# Patient Record
Sex: Male | Born: 1975 | Race: White | Hispanic: No | Smoking: Current every day smoker
Health system: Southern US, Community
[De-identification: ages and names within clinical notes are randomized; demographics above are authoritative.]

## PROBLEM LIST (undated history)

## (undated) DIAGNOSIS — Q049 Congenital malformation of brain, unspecified: Secondary | ICD-10-CM

## (undated) DIAGNOSIS — R1110 Vomiting, unspecified: Secondary | ICD-10-CM

## (undated) DIAGNOSIS — F12188 Cannabis abuse with other cannabis-induced disorder: Secondary | ICD-10-CM

## (undated) DIAGNOSIS — K3184 Gastroparesis: Secondary | ICD-10-CM

## (undated) HISTORY — PX: CHOLECYSTECTOMY: SHX55

---

## 2013-07-22 ENCOUNTER — Emergency Department (HOSPITAL_COMMUNITY)
Admission: EM | Admit: 2013-07-22 | Discharge: 2013-07-22 | Disposition: A | Discharge status: Home / Self Care | Payer: 59 | Attending: Emergency Medicine | Admitting: Emergency Medicine

## 2013-07-22 ENCOUNTER — Encounter (HOSPITAL_COMMUNITY): Payer: Self-pay | Admitting: Emergency Medicine

## 2013-07-22 ENCOUNTER — Emergency Department (HOSPITAL_COMMUNITY): Payer: 59

## 2013-07-22 DIAGNOSIS — R1110 Vomiting, unspecified: Secondary | ICD-10-CM

## 2013-07-22 DIAGNOSIS — R109 Unspecified abdominal pain: Secondary | ICD-10-CM

## 2013-07-22 DIAGNOSIS — R1084 Generalized abdominal pain: Secondary | ICD-10-CM | POA: Insufficient documentation

## 2013-07-22 DIAGNOSIS — Z9089 Acquired absence of other organs: Secondary | ICD-10-CM | POA: Insufficient documentation

## 2013-07-22 DIAGNOSIS — R630 Anorexia: Secondary | ICD-10-CM | POA: Insufficient documentation

## 2013-07-22 DIAGNOSIS — Z87891 Personal history of nicotine dependence: Secondary | ICD-10-CM | POA: Insufficient documentation

## 2013-07-22 DIAGNOSIS — R112 Nausea with vomiting, unspecified: Secondary | ICD-10-CM | POA: Insufficient documentation

## 2013-07-22 LAB — COMPREHENSIVE METABOLIC PANEL
AST: 36 U/L (ref 0–37)
Alkaline Phosphatase: 51 U/L (ref 39–117)
BUN: 20 mg/dL (ref 6–23)
CO2: 22 mEq/L (ref 19–32)
Calcium: 9.7 mg/dL (ref 8.4–10.5)
Chloride: 97 mEq/L (ref 96–112)
Creatinine, Ser: 0.87 mg/dL (ref 0.50–1.35)
GFR calc Af Amer: >90 mL/min (ref >90)
GFR calc non Af Amer: >90 mL/min (ref >90)
Glucose, Bld: 140 mg/dL — ABNORMAL HIGH (ref 70–99)
Total Bilirubin: 0.8 mg/dL (ref 0.3–1.2)

## 2013-07-22 LAB — CBC WITH DIFFERENTIAL/PLATELET
Basophils Absolute: 0 10*3/uL (ref 0.0–0.1)
Eosinophils Relative: 0 % (ref 0–5)
HCT: 50.4 % (ref 39.0–52.0)
Hemoglobin: 18.2 g/dL — ABNORMAL HIGH (ref 13.0–17.0)
Lymphocytes Relative: 17 % (ref 12–46)
Lymphs Abs: 1.6 10*3/uL (ref 0.7–4.0)
MCV: 88 fL (ref 78.0–100.0)
Monocytes Absolute: 1.6 10*3/uL — ABNORMAL HIGH (ref 0.1–1.0)
Monocytes Relative: 17 % — ABNORMAL HIGH (ref 3–12)
Neutro Abs: 6.3 10*3/uL (ref 1.7–7.7)
RDW: 13 % (ref 11.5–15.5)
WBC: 9.5 10*3/uL (ref 4.0–10.5)

## 2013-07-22 LAB — LIPASE, BLOOD: Lipase: 21 U/L (ref 11–59)

## 2013-07-22 LAB — URINALYSIS, ROUTINE W REFLEX MICROSCOPIC
Bilirubin Urine: NEGATIVE
Glucose, UA: NEGATIVE mg/dL
Ketones, ur: 15 mg/dL — AB
Leukocytes, UA: NEGATIVE
Protein, ur: NEGATIVE mg/dL
Urobilinogen, UA: 0.2 mg/dL (ref 0.0–1.0)

## 2013-07-22 MED ORDER — ONDANSETRON HCL 4 MG/2ML IJ SOLN
4.0000 mg | Freq: Once | INTRAMUSCULAR | Status: AC
  Administered 2013-07-22: 4 mg via INTRAVENOUS
  Filled 2013-07-22: qty 2

## 2013-07-22 MED ORDER — IOHEXOL 300 MG/ML  SOLN
25.0000 mL | INTRAMUSCULAR | Status: AC
  Administered 2013-07-22: 25 mL via ORAL

## 2013-07-22 MED ORDER — SODIUM CHLORIDE 0.9 % IV BOLUS (SEPSIS)
1000.0000 mL | Freq: Once | INTRAVENOUS | Status: AC
  Administered 2013-07-22: 1000 mL via INTRAVENOUS

## 2013-07-22 MED ORDER — SODIUM CHLORIDE 0.9 % IV SOLN
INTRAVENOUS | Status: DC
  Administered 2013-07-22 (×2): via INTRAVENOUS

## 2013-07-22 MED ORDER — ONDANSETRON 4 MG PO TBDP: 4.0000 mg | ORAL_TABLET | Freq: Three times a day (TID) | ORAL | Status: DC | PRN

## 2013-07-22 MED ORDER — PROMETHAZINE HCL 25 MG PO TABS: 25.0000 mg | ORAL_TABLET | Freq: Four times a day (QID) | ORAL | Status: DC | PRN

## 2013-07-22 MED ORDER — IOHEXOL 300 MG/ML  SOLN
100.0000 mL | Freq: Once | INTRAMUSCULAR | Status: AC | PRN
  Administered 2013-07-22: 100 mL via INTRAVENOUS

## 2013-07-22 NOTE — ED Provider Notes (Addendum)
CSN: [id number]     Arrival date & time 07/22/13  1221 History   First MD Initiated Contact with Patient 07/22/13 1229     Chief Complaint  Patient presents with  . Emesis   (Consider location/radiation/quality/duration/timing/severity/associated sxs/prior Treatment) Patient is a 37 y.o. male presenting with vomiting. The history is provided by the patient.  Emesis Associated symptoms: abdominal pain   Associated symptoms: no diarrhea and no headaches    patient with frequent and persistent vomiting now for over 48 hours. Was recently visiting in [place] family members there were ill with similar illness. However patient is having abdominal pain. No blood in the vomit no diarrhea. Last time patient had similar symptoms is related to his gallbladder that had to be removed. Abdominal pain stated to be 4 out of 10 diffuse in nature crampy and sharp  History reviewed. No pertinent past medical history. Past Surgical History  Procedure Laterality Date  . Cholecystectomy     History reviewed. No pertinent family history. History  Substance Use Topics  . Smoking status: Former Games developer  . Smokeless tobacco: Not on file  . Alcohol Use: No    Review of Systems  Constitutional: Positive for appetite change. Negative for fever.  HENT: Negative for congestion.   Eyes: Negative for redness.  Respiratory: Negative for shortness of breath.   Cardiovascular: Negative for chest pain.  Gastrointestinal: Positive for nausea, vomiting and abdominal pain. Negative for diarrhea.  Genitourinary: Negative for dysuria.  Musculoskeletal: Negative for back pain and neck pain.  Skin: Negative for rash.  Neurological: Negative for headaches.  Hematological: Does not bruise/bleed easily.  Psychiatric/Behavioral: Negative for confusion.    Allergies  Review of patient's allergies indicates no known allergies.  Home Medications  No current outpatient prescriptions on file. BP 112/62  Pulse 57   Temp(Src) 97.3 F (36.3 C) (Oral)  Resp 20  Ht 6\' 7"  (2.007 m)  Wt 190 lb (86.183 kg)  BMI 21.40 kg/m2  SpO2 95% Physical Exam  Nursing note and vitals reviewed. Constitutional: He is oriented to person, place, and time. He appears well-developed and well-nourished. No distress.  HENT:  Head: Normocephalic and atraumatic.  Next membranes are dry.  Eyes: Conjunctivae and EOM are normal. Pupils are equal, round, and reactive to light.  Neck: Normal range of motion.  Cardiovascular: Normal rate, regular rhythm and normal heart sounds.   No murmur heard. Pulmonary/Chest: Effort normal and breath sounds normal. No respiratory distress.  Abdominal: Soft. Bowel sounds are normal. He exhibits no distension. There is tenderness.  Mild diffuse tenderness.  Musculoskeletal: Normal range of motion. He exhibits no edema.  Neurological: He is alert and oriented to person, place, and time. No cranial nerve deficit. He exhibits normal muscle tone. Coordination normal.  Skin: Skin is warm. No rash noted.    ED Course  Procedures (including critical care time) Labs Review Labs Reviewed  CBC WITH DIFFERENTIAL - Abnormal; Notable for the following:    Hemoglobin 18.2 (*)    MCHC 36.1 (*)    Platelets 140 (*)    Monocytes Relative 17 (*)    Monocytes Absolute 1.6 (*)    All other components within normal limits  COMPREHENSIVE METABOLIC PANEL - Abnormal; Notable for the following:    Potassium 3.1 (*)    Glucose, Bld 140 (*)    All other components within normal limits  LIPASE, BLOOD  URINALYSIS, ROUTINE W REFLEX MICROSCOPIC   Results for orders placed during the hospital encounter of  07/22/13  CBC WITH DIFFERENTIAL      Result Value Range   WBC 9.5  4.0 - 10.5 K/uL   RBC 5.73  4.22 - 5.81 MIL/uL   Hemoglobin 18.2 (*) 13.0 - 17.0 g/dL   HCT 16.1  09.6 - 04.5 %   MCV 88.0  78.0 - 100.0 fL   MCH 31.8  26.0 - 34.0 pg   MCHC 36.1 (*) 30.0 - 36.0 g/dL   RDW 40.9  81.1 - 91.4 %   Platelets  140 (*) 150 - 400 K/uL   Neutrophils Relative % 66  43 - 77 %   Neutro Abs 6.3  1.7 - 7.7 K/uL   Lymphocytes Relative 17  12 - 46 %   Lymphs Abs 1.6  0.7 - 4.0 K/uL   Monocytes Relative 17 (*) 3 - 12 %   Monocytes Absolute 1.6 (*) 0.1 - 1.0 K/uL   Eosinophils Relative 0  0 - 5 %   Eosinophils Absolute 0.0  0.0 - 0.7 K/uL   Basophils Relative 0  0 - 1 %   Basophils Absolute 0.0  0.0 - 0.1 K/uL  COMPREHENSIVE METABOLIC PANEL      Result Value Range   Sodium 136  135 - 145 mEq/L   Potassium 3.1 (*) 3.5 - 5.1 mEq/L   Chloride 97  96 - 112 mEq/L   CO2 22  19 - 32 mEq/L   Glucose, Bld 140 (*) 70 - 99 mg/dL   BUN 20  6 - 23 mg/dL   Creatinine, Ser 7.82  0.50 - 1.35 mg/dL   Calcium 9.7  8.4 - 95.6 mg/dL   Total Protein 8.0  6.0 - 8.3 g/dL   Albumin 4.6  3.5 - 5.2 g/dL   AST 36  0 - 37 U/L   ALT 32  0 - 53 U/L   Alkaline Phosphatase 51  39 - 117 U/L   Total Bilirubin 0.8  0.3 - 1.2 mg/dL   GFR calc non Af Amer >90  >90 mL/min   GFR calc Af Amer >90  >90 mL/min  LIPASE, BLOOD      Result Value Range   Lipase 21  11 - 59 U/L    Imaging Review Ct Abdomen Pelvis W Contrast  07/22/2013   CLINICAL DATA:  Vomiting, unable to eat or drink anything, sick for 2 days  EXAM: CT ABDOMEN AND PELVIS WITH CONTRAST  TECHNIQUE: Multidetector CT imaging of the abdomen and pelvis was performed using the standard protocol following bolus administration of intravenous contrast. Sagittal and coronal MPR images reconstructed from axial data set.  CONTRAST:  OMNIPAQUE IOHEXOL 300 MG/ML SOLN Dilute oral contrast.  COMPARISON:  None  FINDINGS: Lung bases clear.  Gallbladder surgically absent.  10 mm nonspecific low-attenuation focus anteriorly of liver image 20.  Remainder of liver, spleen, pancreas, kidneys, and adrenal glands normal appearance.  Unremarkable bladder and ureters.  Proximal stomach incompletely distended, unable to adequately assess wall thickness.  Stomach and bowel loops otherwise  unremarkable.  No mass, adenopathy, free fluid or inflammatory process.  Few scattered pelvic phleboliths.  No acute osseous findings.  IMPRESSION: Nonspecific 10 mm low-attenuation focus in liver.  Otherwise negative exam.   Electronically Signed   By: Ulyses Southward M.D.   On: 07/22/2013 16:30    EKG Interpretation   None       MDM   1. Vomiting   2. Abdominal pain    The patient's symptoms seem to  be consistent with a viral gastritis. Was recently visiting [place] was exposed to several sick family members. The patient has been having trouble with vomiting significant amounts 12 times today no blood in it no diarrhea for over 48 hours now. Associated with some diffuse abdominal pain. Patient states is similar to the problem he had was gallbladder needed to be removed. Patient states abdominal pain is 6/10 is generalized achy sharp at times. Significant nausea pain is nonradiating not made worse or better by anything. Patient has been to keep any liquids down. CT abdomen was ordered to be complete. Patient has no evidence of leukocytosis liver function test are normal as may just very well be a viral gastroenteritis. Lipase is not elevated not consistent with pancreatitis.  CT scan is negative no acute abdominal process. Symptoms most likely related to a persistent viral gastritis. Patient CO2 shows no signs of significant metabolic acidosis. Will discharge home with anti-medics. Patient responded extremely well to Zofran.  Shelda Jakes, MD 07/22/13 1610  Shelda Jakes, MD 07/22/13 [phone]

## 2013-07-22 NOTE — ED Notes (Signed)
Per pt sts since Monday has been having vomiting. sts was recently in [place] and everyone has been sick that was there. Pt pale. Unable to eat or drink anything.

## 2013-11-13 ENCOUNTER — Encounter (HOSPITAL_COMMUNITY): Payer: Self-pay | Admitting: Emergency Medicine

## 2013-11-13 ENCOUNTER — Emergency Department (HOSPITAL_COMMUNITY)
Admission: EM | Admit: 2013-11-13 | Discharge: 2013-11-13 | Disposition: A | Discharge status: Home / Self Care | Payer: 59 | Attending: Emergency Medicine | Admitting: Emergency Medicine

## 2013-11-13 DIAGNOSIS — R112 Nausea with vomiting, unspecified: Secondary | ICD-10-CM | POA: Insufficient documentation

## 2013-11-13 DIAGNOSIS — Z9089 Acquired absence of other organs: Secondary | ICD-10-CM | POA: Insufficient documentation

## 2013-11-13 DIAGNOSIS — R630 Anorexia: Secondary | ICD-10-CM | POA: Insufficient documentation

## 2013-11-13 DIAGNOSIS — R1084 Generalized abdominal pain: Secondary | ICD-10-CM | POA: Insufficient documentation

## 2013-11-13 DIAGNOSIS — R5383 Other fatigue: Secondary | ICD-10-CM

## 2013-11-13 DIAGNOSIS — R198 Other specified symptoms and signs involving the digestive system and abdomen: Secondary | ICD-10-CM | POA: Insufficient documentation

## 2013-11-13 DIAGNOSIS — Z87891 Personal history of nicotine dependence: Secondary | ICD-10-CM | POA: Insufficient documentation

## 2013-11-13 DIAGNOSIS — R5381 Other malaise: Secondary | ICD-10-CM | POA: Insufficient documentation

## 2013-11-13 LAB — COMPREHENSIVE METABOLIC PANEL
ALT: 45 U/L (ref 0–53)
AST: 44 U/L — ABNORMAL HIGH (ref 0–37)
Albumin: 4.6 g/dL (ref 3.5–5.2)
Alkaline Phosphatase: 53 U/L (ref 39–117)
BILIRUBIN TOTAL: 1.7 mg/dL — AB (ref 0.3–1.2)
BUN: 16 mg/dL (ref 6–23)
CO2: 22 mEq/L (ref 19–32)
Calcium: 9.7 mg/dL (ref 8.4–10.5)
Chloride: 98 mEq/L (ref 96–112)
Creatinine, Ser: 0.93 mg/dL (ref 0.50–1.35)
GFR calc Af Amer: >90 mL/min (ref >90)
GFR calc non Af Amer: >90 mL/min (ref >90)
Glucose, Bld: 142 mg/dL — ABNORMAL HIGH (ref 70–99)
Potassium: 3.1 mEq/L — ABNORMAL LOW (ref 3.7–5.3)
Sodium: 139 mEq/L (ref 137–147)
TOTAL PROTEIN: 7.7 g/dL (ref 6.0–8.3)

## 2013-11-13 LAB — CBC WITH DIFFERENTIAL/PLATELET
Basophils Absolute: 0 10*3/uL (ref 0.0–0.1)
Basophils Relative: 0 % (ref 0–1)
EOS ABS: 0 10*3/uL (ref 0.0–0.7)
Eosinophils Relative: 0 % (ref 0–5)
HCT: 51.2 % (ref 39.0–52.0)
HEMOGLOBIN: 18.5 g/dL — AB (ref 13.0–17.0)
LYMPHS ABS: 1.7 10*3/uL (ref 0.7–4.0)
LYMPHS PCT: 14 % (ref 12–46)
MCH: 32.3 pg (ref 26.0–34.0)
MCHC: 36.1 g/dL — ABNORMAL HIGH (ref 30.0–36.0)
MCV: 89.5 fL (ref 78.0–100.0)
MONOS PCT: 15 % — AB (ref 3–12)
Monocytes Absolute: 2 10*3/uL — ABNORMAL HIGH (ref 0.1–1.0)
NEUTROS PCT: 71 % (ref 43–77)
Neutro Abs: 9.2 10*3/uL — ABNORMAL HIGH (ref 1.7–7.7)
Platelets: 146 10*3/uL — ABNORMAL LOW (ref 150–400)
RBC: 5.72 MIL/uL (ref 4.22–5.81)
RDW: 13.2 % (ref 11.5–15.5)
WBC: 12.9 10*3/uL — ABNORMAL HIGH (ref 4.0–10.5)

## 2013-11-13 LAB — URINALYSIS, ROUTINE W REFLEX MICROSCOPIC
Glucose, UA: NEGATIVE mg/dL
Hgb urine dipstick: NEGATIVE
KETONES UR: 15 mg/dL — AB
NITRITE: NEGATIVE
PROTEIN: 30 mg/dL — AB
SPECIFIC GRAVITY, URINE: 1.033 — AB (ref 1.005–1.030)
UROBILINOGEN UA: 1 mg/dL (ref 0.0–1.0)
pH: 6 (ref 5.0–8.0)

## 2013-11-13 LAB — URINE MICROSCOPIC-ADD ON

## 2013-11-13 LAB — RAPID URINE DRUG SCREEN, HOSP PERFORMED
Amphetamines: NOT DETECTED
BARBITURATES: NOT DETECTED
Benzodiazepines: NOT DETECTED
Cocaine: NOT DETECTED
Opiates: NOT DETECTED
TETRAHYDROCANNABINOL: POSITIVE — AB

## 2013-11-13 LAB — LIPASE, BLOOD: LIPASE: 21 U/L (ref 11–59)

## 2013-11-13 MED ORDER — SODIUM CHLORIDE 0.9 % IV SOLN
1000.0000 mL | Freq: Once | INTRAVENOUS | Status: AC
  Administered 2013-11-13: 1000 mL via INTRAVENOUS

## 2013-11-13 MED ORDER — DICYCLOMINE HCL 10 MG PO CAPS
10.0000 mg | ORAL_CAPSULE | Freq: Once | ORAL | Status: AC
  Administered 2013-11-13: 10 mg via ORAL
  Filled 2013-11-13: qty 1

## 2013-11-13 MED ORDER — DICYCLOMINE HCL 20 MG PO TABS: 20.0000 mg | ORAL_TABLET | Freq: Two times a day (BID) | ORAL | Status: DC

## 2013-11-13 MED ORDER — SODIUM CHLORIDE 0.9 % IV SOLN
1000.0000 mL | INTRAVENOUS | Status: DC
  Administered 2013-11-13: 1000 mL via INTRAVENOUS

## 2013-11-13 MED ORDER — PANTOPRAZOLE SODIUM 40 MG IV SOLR
40.0000 mg | INTRAVENOUS | Status: AC
  Administered 2013-11-13: 40 mg via INTRAVENOUS
  Filled 2013-11-13: qty 40

## 2013-11-13 MED ORDER — ONDANSETRON HCL 4 MG/2ML IJ SOLN
4.0000 mg | Freq: Once | INTRAMUSCULAR | Status: AC
  Administered 2013-11-13: 4 mg via INTRAVENOUS
  Filled 2013-11-13: qty 2

## 2013-11-13 NOTE — ED Provider Notes (Signed)
CSN: [id number]     Arrival date & time 11/13/13  0559 History   First MD Initiated Contact with Patient 11/13/13 [phone]     Chief Complaint  Patient presents with  . Emesis  . Abdominal Pain     (Consider location/radiation/quality/duration/timing/severity/associated sxs/prior Treatment) HPI  This is a 38 year old male who presents emergency department with chief complaint of abdominal pain, nausea and vomiting for the past 3 days.  He is a past surgical history of cholecystectomy. The patient states that he has had a diffuse, crampy, sharp abdominal pain, or retching and vomiting nonbilious nonbloody vomitus.  He states that his roommate had the same symptoms just before he got them.  He states he did try some marijuana 2 days ago to see if it would help alleviate his nausea.  He denies frequent or heavy use of marijuana.  Patient denies alcohol abuse. He did remember that he had some Phenergan and Zofran she began using yesterday.  He stated this helped stop his vomiting however every time he tried to drink fluids he would have severe epigastric pain.  Patient denies diarrhea, fever, chills.  No past medical history on file. Past Surgical History  Procedure Laterality Date  . Cholecystectomy     No family history on file. History  Substance Use Topics  . Smoking status: Former Research scientist (life sciences)  . Smokeless tobacco: Not on file  . Alcohol Use: No    Review of Systems  Constitutional: Positive for activity change, appetite change and fatigue. Negative for fever, chills and diaphoresis.  HENT: Negative.   Eyes: Negative.   Respiratory: Negative.   Cardiovascular: Negative.   Gastrointestinal: Positive for nausea, vomiting and abdominal pain. Negative for diarrhea, blood in stool and abdominal distention.  Genitourinary: Positive for decreased urine volume (first urine OP in 2 dfays this morning).  Musculoskeletal: Negative.   All other systems reviewed and are negative.      Allergies   Review of patient's allergies indicates no known allergies.  Home Medications   Current Outpatient Rx  Name  Route  Sig  Dispense  Refill  . ondansetron (ZOFRAN ODT) 4 MG disintegrating tablet   Oral   Take 1 tablet (4 mg total) by mouth every 8 (eight) hours as needed for nausea or vomiting.   12 tablet   1   . promethazine (PHENERGAN) 25 MG tablet   Oral   Take 1 tablet (25 mg total) by mouth every 6 (six) hours as needed for nausea or vomiting.   12 tablet   1    BP 140/99  Pulse 79  Temp(Src) 97.5 F (36.4 C) (Oral)  Resp 16  Ht 6\' 7"  (2.007 m)  Wt 200 lb (90.719 kg)  BMI 22.52 kg/m2  SpO2 97% Physical Exam  Nursing note and vitals reviewed. Constitutional: He appears well-developed and well-nourished. No distress.  HENT:  Head: Normocephalic and atraumatic.  Eyes: Conjunctivae are normal. No scleral icterus.  Neck: Normal range of motion. Neck supple.  Cardiovascular: Normal rate, regular rhythm and normal heart sounds.   Pulmonary/Chest: Effort normal and breath sounds normal. No respiratory distress.  Abdominal: Soft. There is no tenderness.  Musculoskeletal: He exhibits no edema.  Neurological: He is alert.  Skin: Skin is warm and dry. He is not diaphoretic.  Psychiatric: His behavior is normal.   . ED Course  Procedures (including critical care time) Labs Review Labs Reviewed - No data to display Imaging Review No results found.  EKG Interpretation  None       MDM   Final diagnoses:  Nausea and vomiting  Tenesmus    Patient with symptoms consistent with viral gastroenteritis.  Vitals are stable, no fever. tolerating PO fluids > 6 oz.  Lungs are clear.  No focal abdominal pain, no concern for appendicitis, cholecystitis, pancreatitis, ruptured viscus, UTI, kidney stone, or any other abdominal etiology.  Supportive therapy indicated with return if symptoms worsen.  Patient counseled. Has antiemetics from previous visit.     Margarita Mail, PA-C 11/13/13 [phone]

## 2013-11-13 NOTE — ED Notes (Signed)
Pt states he was seen in Aug for a stomach bug and given antinausea medications. Pt states he started vomiting on Tuesday after his friend and roommate have passed around another stomach bug. Pt states he tried the medications (zofran around 04:00 this morning and phenergan last night) pt states that he is still unable to keep food or water down. Pt states abdominal pain when he tries to eat or drink.

## 2013-11-13 NOTE — ED Notes (Signed)
Abigail, PA back at the bedside.

## 2013-11-13 NOTE — Discharge Instructions (Signed)
Abdominal (belly) pain can be caused by many things. Your caregiver performed an examination and possibly ordered blood/urine tests and imaging (CT scan, x-rays, ultrasound). Many cases can be observed and treated at home after initial evaluation in the emergency department. Even though you are being discharged home, abdominal pain can be unpredictable. Therefore, you need a repeated exam if your pain does not resolve, returns, or worsens. Most patients with abdominal pain don't have to be admitted to the hospital or have surgery, but serious problems like appendicitis and gallbladder attacks can start out as nonspecific pain. Many abdominal conditions cannot be diagnosed in one visit, so follow-up evaluations are very important. SEEK IMMEDIATE MEDICAL ATTENTION IF: The pain does not go away or becomes severe.  A temperature above 101 develops.  Repeated vomiting occurs (multiple episodes).  The pain becomes localized to portions of the abdomen. The right side could possibly be appendicitis. In an adult, the left lower portion of the abdomen could be colitis or diverticulitis.  Blood is being passed in stools or vomit (bright red or black tarry stools).  Return also if you develop chest pain, difficulty breathing, dizziness or fainting, or become confused, poorly responsive, or inconsolable (young children).    Abdominal Pain, Adult Many things can cause abdominal pain. Usually, abdominal pain is not caused by a disease and will improve without treatment. It can often be observed and treated at home. Your health care provider will do a physical exam and possibly order blood tests and X-rays to help determine the seriousness of your pain. However, in many cases, more time must pass before a clear cause of the pain can be found. Before that point, your health care provider may not know if you need more testing or further treatment. HOME CARE INSTRUCTIONS  Monitor your abdominal pain for any changes. The  following actions may help to alleviate any discomfort you are experiencing:  Only take over-the-counter or prescription medicines as directed by your health care provider.  Do not take laxatives unless directed to do so by your health care provider.  Try a clear liquid diet (broth, tea, or water) as directed by your health care provider. Slowly move to a bland diet as tolerated. SEEK MEDICAL CARE IF:  You have unexplained abdominal pain.  You have abdominal pain associated with nausea or diarrhea.  You have pain when you urinate or have a bowel movement.  You experience abdominal pain that wakes you in the night.  You have abdominal pain that is worsened or improved by eating food.  You have abdominal pain that is worsened with eating fatty foods. SEEK IMMEDIATE MEDICAL CARE IF:   Your pain does not go away within 2 hours.  You have a fever.  You keep throwing up (vomiting).  Your pain is felt only in portions of the abdomen, such as the right side or the left lower portion of the abdomen.  You pass bloody or black tarry stools. MAKE SURE YOU:  Understand these instructions.   Will watch your condition.   Will get help right away if you are not doing well or get worse.  Document Released: 06/13/2005 Document Revised: 06/24/2013 Document Reviewed: 05/13/2013 [facility] Patient Information 2014 [place].  Nausea and Vomiting Nausea is a sick feeling that often comes before throwing up (vomiting). Vomiting is a reflex where stomach contents come out of your mouth. Vomiting can cause severe loss of body fluids (dehydration). Children and elderly adults can become dehydrated quickly, especially if  they also have diarrhea. Nausea and vomiting are symptoms of a condition or disease. It is important to find the cause of your symptoms. CAUSES   Direct irritation of the stomach lining. This irritation can result from increased acid production (gastroesophageal reflux  disease), infection, food poisoning, taking certain medicines (such as nonsteroidal anti-inflammatory drugs), alcohol use, or tobacco use.  Signals from the brain.These signals could be caused by a headache, heat exposure, an inner ear disturbance, increased pressure in the brain from injury, infection, a tumor, or a concussion, pain, emotional stimulus, or metabolic problems.  An obstruction in the gastrointestinal tract (bowel obstruction).  Illnesses such as diabetes, hepatitis, gallbladder problems, appendicitis, kidney problems, cancer, sepsis, atypical symptoms of a heart attack, or eating disorders.  Medical treatments such as chemotherapy and radiation.  Receiving medicine that makes you sleep (general anesthetic) during surgery. DIAGNOSIS Your caregiver may ask for tests to be done if the problems do not improve after a few days. Tests may also be done if symptoms are severe or if the reason for the nausea and vomiting is not clear. Tests may include:  Urine tests.  Blood tests.  Stool tests.  Cultures (to look for evidence of infection).  X-rays or other imaging studies. Test results can help your caregiver make decisions about treatment or the need for additional tests. TREATMENT You need to stay well hydrated. Drink frequently but in small amounts.You may wish to drink water, sports drinks, clear broth, or eat frozen ice pops or gelatin dessert to help stay hydrated.When you eat, eating slowly may help prevent nausea.There are also some antinausea medicines that may help prevent nausea. HOME CARE INSTRUCTIONS   Take all medicine as directed by your caregiver.  If you do not have an appetite, do not force yourself to eat. However, you must continue to drink fluids.  If you have an appetite, eat a normal diet unless your caregiver tells you differently.  Eat a variety of complex carbohydrates (rice, wheat, potatoes, bread), lean meats, yogurt, fruits, and  vegetables.  Avoid high-fat foods because they are more difficult to digest.  Drink enough water and fluids to keep your urine clear or pale yellow.  If you are dehydrated, ask your caregiver for specific rehydration instructions. Signs of dehydration may include:  Severe thirst.  Dry lips and mouth.  Dizziness.  Dark urine.  Decreasing urine frequency and amount.  Confusion.  Rapid breathing or pulse. SEEK IMMEDIATE MEDICAL CARE IF:   You have blood or brown flecks (like coffee grounds) in your vomit.  You have black or bloody stools.  You have a severe headache or stiff neck.  You are confused.  You have severe abdominal pain.  You have chest pain or trouble breathing.  You do not urinate at least once every 8 hours.  You develop cold or clammy skin.  You continue to vomit for longer than 24 to 48 hours.  You have a fever. MAKE SURE YOU:   Understand these instructions.  Will watch your condition.  Will get help right away if you are not doing well or get worse. Document Released: 09/03/2005 Document Revised: 11/26/2011 Document Reviewed: 01/31/2011 [facility] Patient Information 2014 [place], [place].

## 2013-11-14 NOTE — ED Provider Notes (Signed)
Medical screening examination/treatment/procedure(s) were performed by non-physician practitioner and as supervising physician I was immediately available for consultation/collaboration.   EKG Interpretation None        Julianne Rice, MD 11/14/13 [phone]

## 2014-06-01 ENCOUNTER — Emergency Department (HOSPITAL_COMMUNITY)
Admission: EM | Admit: 2014-06-01 | Discharge: 2014-06-01 | Disposition: A | Discharge status: Home / Self Care | Payer: 59 | Attending: Emergency Medicine | Admitting: Emergency Medicine

## 2014-06-01 ENCOUNTER — Emergency Department (HOSPITAL_COMMUNITY): Payer: 59

## 2014-06-01 ENCOUNTER — Encounter (HOSPITAL_COMMUNITY): Payer: Self-pay | Admitting: Emergency Medicine

## 2014-06-01 DIAGNOSIS — R197 Diarrhea, unspecified: Secondary | ICD-10-CM | POA: Diagnosis not present

## 2014-06-01 DIAGNOSIS — R059 Cough, unspecified: Secondary | ICD-10-CM | POA: Diagnosis not present

## 2014-06-01 DIAGNOSIS — R6883 Chills (without fever): Secondary | ICD-10-CM | POA: Insufficient documentation

## 2014-06-01 DIAGNOSIS — IMO0001 Reserved for inherently not codable concepts without codable children: Secondary | ICD-10-CM | POA: Insufficient documentation

## 2014-06-01 DIAGNOSIS — R112 Nausea with vomiting, unspecified: Secondary | ICD-10-CM

## 2014-06-01 DIAGNOSIS — R05 Cough: Secondary | ICD-10-CM | POA: Insufficient documentation

## 2014-06-01 DIAGNOSIS — Z87891 Personal history of nicotine dependence: Secondary | ICD-10-CM | POA: Insufficient documentation

## 2014-06-01 LAB — CBC WITH DIFFERENTIAL/PLATELET
BASOS PCT: 0 % (ref 0–1)
Basophils Absolute: 0 10*3/uL (ref 0.0–0.1)
Eosinophils Absolute: 0 10*3/uL (ref 0.0–0.7)
Eosinophils Relative: 0 % (ref 0–5)
HEMATOCRIT: 49.8 % (ref 39.0–52.0)
Hemoglobin: 17.9 g/dL — ABNORMAL HIGH (ref 13.0–17.0)
Lymphocytes Relative: 7 % — ABNORMAL LOW (ref 12–46)
Lymphs Abs: 0.8 10*3/uL (ref 0.7–4.0)
MCH: 31 pg (ref 26.0–34.0)
MCHC: 35.9 g/dL (ref 30.0–36.0)
MCV: 86.2 fL (ref 78.0–100.0)
MONO ABS: 1 10*3/uL (ref 0.1–1.0)
MONOS PCT: 8 % (ref 3–12)
NEUTROS ABS: 10.3 10*3/uL — AB (ref 1.7–7.7)
Neutrophils Relative %: 85 % — ABNORMAL HIGH (ref 43–77)
Platelets: 182 10*3/uL (ref 150–400)
RBC: 5.78 MIL/uL (ref 4.22–5.81)
RDW: 13.2 % (ref 11.5–15.5)
WBC: 12.2 10*3/uL — ABNORMAL HIGH (ref 4.0–10.5)

## 2014-06-01 LAB — COMPREHENSIVE METABOLIC PANEL
ALK PHOS: 68 U/L (ref 39–117)
ALT: 35 U/L (ref 0–53)
ANION GAP: 22 — AB (ref 5–15)
AST: 23 U/L (ref 0–37)
Albumin: 4.8 g/dL (ref 3.5–5.2)
BILIRUBIN TOTAL: 0.7 mg/dL (ref 0.3–1.2)
BUN: 13 mg/dL (ref 6–23)
CHLORIDE: 103 meq/L (ref 96–112)
CO2: 16 mEq/L — ABNORMAL LOW (ref 19–32)
Calcium: 10.1 mg/dL (ref 8.4–10.5)
Creatinine, Ser: 0.91 mg/dL (ref 0.50–1.35)
GLUCOSE: 168 mg/dL — AB (ref 70–99)
POTASSIUM: 3.9 meq/L (ref 3.7–5.3)
Sodium: 141 mEq/L (ref 137–147)
Total Protein: 8.2 g/dL (ref 6.0–8.3)

## 2014-06-01 LAB — BASIC METABOLIC PANEL
Anion gap: 21 — ABNORMAL HIGH (ref 5–15)
BUN: 14 mg/dL (ref 6–23)
CALCIUM: 10 mg/dL (ref 8.4–10.5)
CO2: 18 mEq/L — ABNORMAL LOW (ref 19–32)
CREATININE: 0.91 mg/dL (ref 0.50–1.35)
Chloride: 103 mEq/L (ref 96–112)
GLUCOSE: 142 mg/dL — AB (ref 70–99)
Potassium: 3.6 mEq/L — ABNORMAL LOW (ref 3.7–5.3)
Sodium: 142 mEq/L (ref 137–147)

## 2014-06-01 LAB — LIPASE, BLOOD: LIPASE: 14 U/L (ref 11–59)

## 2014-06-01 LAB — I-STAT CG4 LACTIC ACID, ED: Lactic Acid, Venous: 1.86 mmol/L (ref 0.5–2.2)

## 2014-06-01 MED ORDER — DICYCLOMINE HCL 10 MG PO CAPS
10.0000 mg | ORAL_CAPSULE | Freq: Once | ORAL | Status: AC
  Administered 2014-06-01: 10 mg via ORAL
  Filled 2014-06-01: qty 1

## 2014-06-01 MED ORDER — FAMOTIDINE IN NACL 20-0.9 MG/50ML-% IV SOLN
20.0000 mg | Freq: Once | INTRAVENOUS | Status: AC
  Administered 2014-06-01: 20 mg via INTRAVENOUS
  Filled 2014-06-01: qty 50

## 2014-06-01 MED ORDER — ONDANSETRON HCL 4 MG/2ML IJ SOLN
4.0000 mg | Freq: Once | INTRAMUSCULAR | Status: AC
  Administered 2014-06-01: 4 mg via INTRAVENOUS
  Filled 2014-06-01: qty 2

## 2014-06-01 MED ORDER — LORAZEPAM 2 MG/ML IJ SOLN
1.0000 mg | Freq: Once | INTRAMUSCULAR | Status: AC
  Administered 2014-06-01: 1 mg via INTRAVENOUS
  Filled 2014-06-01: qty 1

## 2014-06-01 MED ORDER — PROMETHAZINE HCL 25 MG PO TABS: 25.0000 mg | ORAL_TABLET | Freq: Four times a day (QID) | ORAL | Status: DC | PRN

## 2014-06-01 MED ORDER — ONDANSETRON HCL 4 MG/2ML IJ SOLN: 4.0000 mg | Freq: Once | INTRAMUSCULAR | Status: DC

## 2014-06-01 MED ORDER — SODIUM CHLORIDE 0.9 % IV BOLUS (SEPSIS)
2000.0000 mL | Freq: Once | INTRAVENOUS | Status: AC
  Administered 2014-06-01: 2000 mL via INTRAVENOUS

## 2014-06-01 MED ORDER — ONDANSETRON 4 MG PO TBDP
8.0000 mg | ORAL_TABLET | Freq: Once | ORAL | Status: AC
  Administered 2014-06-01: 8 mg via ORAL
  Filled 2014-06-01: qty 2

## 2014-06-01 NOTE — ED Provider Notes (Signed)
CSN: [id number]     Arrival date & time 06/01/14  3 History   First MD Initiated Contact with Patient 06/01/14 1551     Chief Complaint  Patient presents with  . Nausea  . Emesis  . Diarrhea     (Consider location/radiation/quality/duration/timing/severity/associated sxs/prior Treatment) HPI  Steven Black is a 38 y.o. male who is otherwise healthy complaining of myalgia, chills, nausea vomiting and diarrhea onset yesterday. Patient denies abdominal pain but reports bilateral flank pain. He denies fever, sick contacts, recent travel, camping, tick bite, rash, headache, chest pain. Patient endorses a dry cough on review of systems. Denies any alcohol use, NSAID use, melena, hematochezia, states that the vomitus is nonbloody and nonbilious. States that he feels dehydrated.  History reviewed. No pertinent past medical history. Past Surgical History  Procedure Laterality Date  . Cholecystectomy     No family history on file. History  Substance Use Topics  . Smoking status: Former Research scientist (life sciences)  . Smokeless tobacco: Not on file  . Alcohol Use: No    Review of Systems  10 systems reviewed and found to be negative, except as noted in the HPI.   Allergies  Review of patient's allergies indicates no known allergies.  Home Medications   Prior to Admission medications   Medication Sig Start Date End Date Taking? Authorizing Provider  promethazine (PHENERGAN) 25 MG tablet Take 1 tablet (25 mg total) by mouth every 6 (six) hours as needed for nausea or vomiting. 06/01/14   Elmyra Ricks Severina Sykora, PA-C   BP 133/71  Pulse 104  Temp(Src) 98.4 F (36.9 C) (Oral)  Resp 16  Ht 6\' 7"  (2.007 m)  Wt 190 lb (86.183 kg)  BMI 21.40 kg/m2  SpO2 100% Physical Exam  Nursing note and vitals reviewed. Constitutional: He is oriented to person, place, and time. He appears well-developed and well-nourished. No distress.  HENT:  Head: Normocephalic.  Dry mucous membranes  Eyes: Conjunctivae and EOM are  normal.  Neck: Normal range of motion.  Cardiovascular: Normal rate, regular rhythm and intact distal pulses.   Pulmonary/Chest: Effort normal and breath sounds normal. No stridor. No respiratory distress. He has no wheezes. He has no rales. He exhibits no tenderness.  Abdominal: Soft. Bowel sounds are normal. He exhibits no distension and no mass. There is no tenderness. There is no rebound and no guarding.  Musculoskeletal: Normal range of motion.  Neurological: He is alert and oriented to person, place, and time.  Psychiatric: He has a normal mood and affect.    ED Course  Procedures (including critical care time) Labs Review Labs Reviewed  CBC WITH DIFFERENTIAL - Abnormal; Notable for the following:    WBC 12.2 (*)    Hemoglobin 17.9 (*)    Neutrophils Relative % 85 (*)    Neutro Abs 10.3 (*)    Lymphocytes Relative 7 (*)    All other components within normal limits  COMPREHENSIVE METABOLIC PANEL - Abnormal; Notable for the following:    CO2 16 (*)    Glucose, Bld 168 (*)    Anion gap 22 (*)    All other components within normal limits  BASIC METABOLIC PANEL - Abnormal; Notable for the following:    Potassium 3.6 (*)    CO2 18 (*)    Glucose, Bld 142 (*)    Anion gap 21 (*)    All other components within normal limits  LIPASE, BLOOD  I-STAT CG4 LACTIC ACID, ED    Imaging Review Dg Abd Acute  W/chest  06/01/2014   CLINICAL DATA:  Body aches with nausea vomiting and diarrhea beginning yesterday  EXAM: ACUTE ABDOMEN SERIES (ABDOMEN 2 VIEW & CHEST 1 VIEW)  COMPARISON:  Abdominal pelvic CT scan of July 22, 2013  FINDINGS: The lungs are mildly hyperinflated but clear. The heart and mediastinal structures are normal. There is no pleural effusion. There are loops of mildly distended gas-filled small bowel in the left lower quadrant. No significant rectal gas is demonstrated. There are surgical clips in the gallbladder fossa. There is a phlebolith in the left aspect of the pelvis.  The lumbar spine and observed portions of the bony pelvis are normal.  IMPRESSION: 1. The bowel gas pattern is nonspecific. There is no evidence of obstruction or perforation. 2. There is no acute cardiopulmonary abnormality.   Electronically Signed   By: Amro  [place]   On: 06/01/2014 17:09     EKG Interpretation None      MDM   Final diagnoses:  Nausea vomiting and diarrhea    Filed Vitals:   06/01/14 1344 06/01/14 1639 06/01/14 2029  BP: 130/87 144/88 133/71  Pulse: 96 66 104  Temp: 98.2 F (36.8 C) 98.4 F (36.9 C)   TempSrc: Oral Oral Oral  Resp: 20 12 16   Height: 6\' 7"  (2.007 m)    Weight: 190 lb (86.183 kg)    SpO2: 97% 98% 100%    Medications  ondansetron (ZOFRAN-ODT) disintegrating tablet 8 mg (8 mg Oral Given 06/01/14 1356)  sodium chloride 0.9 % bolus 2,000 mL (0 mLs Intravenous Stopped 06/01/14 2032)  ondansetron (ZOFRAN) injection 4 mg (4 mg Intravenous Given 06/01/14 1610)  famotidine (PEPCID) IVPB 20 mg (0 mg Intravenous Stopped 06/01/14 2032)  dicyclomine (BENTYL) capsule 10 mg (10 mg Oral Given 06/01/14 1731)  LORazepam (ATIVAN) injection 1 mg (1 mg Intravenous Given 06/01/14 1731)    Steven Black is a 37 y.o. male presenting with nausea vomiting and diarrhea. Patient appears dehydrated. Vital signs with no abnormalities. Abdominal exam is benign. Patient mildly acidotic, appears, hemoconcentrated with a white blood cell count of 12.2.  Serial abdominal exams are benign however patient has vomited again in the ED. Patient will be given Ativan and fluids were kept wide open.  Patient with normal lactic acid mildly decreased bicarbonate at 18. Increased anion gap. Patient directly asked about ingestions, alcohol which he denies. Case discussed with attending physician who agrees with stability to discharge to home. Extensive discussion of return precautions. Patient is asked to return to the ED immediately with any worsening of symptoms.  Evaluation does not show  pathology that would require ongoing emergent intervention or inpatient treatment. Pt is hemodynamically stable and mentating appropriately. Discussed findings and plan with patient/guardian, who agrees with care plan. All questions answered. Return precautions discussed and outpatient follow up given.   Discharge Medication List as of 06/01/2014  7:39 PM    START taking these medications   Details  promethazine (PHENERGAN) 25 MG tablet Take 1 tablet (25 mg total) by mouth every 6 (six) hours as needed for nausea or vomiting., Starting 06/01/2014, Until Discontinued, [employer], PA-C 06/02/14 [phone]

## 2014-06-01 NOTE — ED Notes (Addendum)
Pt actively vomitting, not able to drink fluids at this time.  Also asked pt for a urine sample and still unable to provide at this time.

## 2014-06-01 NOTE — ED Notes (Signed)
Pt c/o n/v/d that started yesterday. Denies abd pain/ anyone else being sick. Pt heard dry heaving earlier. Not actively vomiting at this time. Pt reports " I feel very dehydrated". Nad, skin warm and dry, resp e/u.

## 2014-06-01 NOTE — ED Notes (Signed)
Asked pt to provide urine specimen pt stated he could not provide one at this time.  

## 2014-06-01 NOTE — Discharge Instructions (Signed)
Push fluids: take small frequent sips of water or Gatorade, do not drink any soda, juice or caffeinated beverages.    Slowly resume solid diet as desired. Avoid food that are spicy, contain dairy and/or have high fat content.  Aviod NSAIDs (aspirin, motrin, ibuprofen, naproxen, Aleve et Ronney Asters) for pain control because they will irritate your stomach.  Do not hesitate to return to the emergency room for any new, worsening or concerning symptoms.  Please obtain primary care using resource guide below. But the minute you were seen in the emergency room and that they will need to obtain records for further outpatient management.     Emergency Department Resource Guide 1) Find a Doctor and Pay Out of Pocket Although you won't have to find out who is covered by your insurance plan, it is a good idea to ask around and get recommendations. You will then need to call the office and see if the doctor you have chosen will accept you as a new patient and what types of options they offer for patients who are self-pay. Some doctors offer discounts or will set up payment plans for their patients who do not have insurance, but you will need to ask so you aren't surprised when you get to your appointment.  2) Contact Your Local Health Department Not all health departments have doctors that can see patients for sick visits, but many do, so it is worth a call to see if yours does. If you don't know where your local health department is, you can check in your phone book. The CDC also has a tool to help you locate your state's health department, and many state websites also have listings of all of their local health departments.  3) Find a [facility] If your illness is not likely to be very severe or complicated, you may want to try a walk in clinic. These are popping up all over the country in pharmacies, drugstores, and shopping centers. They're usually staffed by nurse practitioners or physician assistants  that have been trained to treat common illnesses and complaints. They're usually fairly quick and inexpensive. However, if you have serious medical issues or chronic medical problems, these are probably not your best option.  No Primary Care Doctor: - Call Health Connect at  [phone] - they can help you locate a primary care doctor that  accepts your insurance, provides certain services, etc. - Physician Referral Service- [phone]  Chronic Pain Problems: Organization         Address  Phone   Notes  [facility]  [phone] Patients need to be referred by their primary care doctor.   Medication Assistance: Organization         Address  Phone   Notes  [facility] Medication [facility] [address] [place], [place] [address] [phone] --Must be a resident of [facility] -- Must have NO insurance coverage whatsoever (no Medicaid/ Medicare, etc.) -- The pt. MUST have a primary care doctor that directs their care regularly and follows them in the community   MedAssist  [phone]   [employer]  [phone]    Agencies that provide inexpensive medical care: Organization         Address  Phone   Notes  [place]  [phone]   [facility]    [phone]   [facility] [place], [place] [address] [phone]  [place] [address], [place] [phone]   Planned Parenthood    [phone]   [facility]    [phone]   [place] and [place] [address], [place] Phone:  [phone], Fax:  [phone] Hours of Operation:  9 am - 6 pm, M-F.  Also accepts Medicaid/Medicare and self-pay.  [facility] [place] for [place], [address], [place] Phone: [phone], Fax: [phone]. Hours of Operation:  8:30 am - 5:30 pm, M-F.  Also accepts Medicaid  and self-pay.  [facility] [place] [address], [place] Phone: [phone]   [place], [place], [place] [phone], Ext. [phone] Mondays & Thursdays: 7-9 AM.  First 15 patients are seen on a first come, first serve basis.    [place] Providers:  Organization         Address  Phone   Notes  [facility] [address], [place] [phone] Also accepts self-pay patients.  [facility] [address], [place]  [phone]   [place], [address], [place] [phone]   [facility] [address] [place] [phone]   Lucianne Lei [address], [place]   [phone] Only accepts [place] Access [place] patients after they have their name applied to their card.   Self-Pay (no insurance) in [facility]:  Organization         Address  Phone   Notes  Sickle Cell Patients, [facility] [place] [phone]   [facility] [place] [phone]   [facility] [place]  [place], [place], [place] [phone]   [facility]/Dr. Osei-Bonsu  [address] [place] or [address], [place] [phone] Phone number for both [place] and [place] locations is the same.  [facility] [address] [place] [phone]   [facility] [address] [place] or [address] [phone]   [facility] [address] [place] [phone], phone; [phone], fax Sees patients 1st and 3rd Saturday of every month.  Must not qualify for public or private insurance (i.e. Medicaid, Medicare, [place] Health Choice, Veterans' Benefits)  Household income  should be no more than 200% of the poverty level The clinic cannot treat you if you are pregnant or think you are pregnant  Sexually transmitted diseases are not treated at the clinic.    Dental Care: Organization         Address  Phone  Notes  [facility] Department of [facility] [place] [phone] Accepts children up to age 15 who are enrolled in [place] or [place]; pregnant women with a Medicaid card; and children who have applied for Medicaid or [place] Health Choice, but were declined, whose parents can pay a reduced fee at time of service.  [facility] Department of [facility]  [address], [place] [phone] Accepts children up to age 32 who are enrolled in [place] or [place]; pregnant women with a  Medicaid card; and children who have applied for Medicaid or [place] Health Choice, but were declined, whose parents can pay a reduced fee at time of service.  [place] [facility] Access [facility]  [address] [phone] Patients are seen by appointment only. Walk-ins are not accepted. [place] will see patients 78 years of age and older. Monday - Tuesday (8am-5pm) Most Wednesdays (8:30-5pm) $30 per visit, cash only  [facility] Adult Dental Access PROGRAM  [address], [facility] [phone] Patients are seen by appointment only. Walk-ins are not accepted. [place] will see patients 30 years of age and older. One Wednesday Evening (Monthly: Volunteer Based).  $30 per visit, cash only  [place]  [phone] for adults; Children under age 79, call Graduate Pediatric Dentistry at [phone]. Children aged 33-14, please call [phone] to request a pediatric application.  Dental services are provided in all areas of dental care including fillings, crowns and bridges, complete and partial dentures, implants, gum treatment,  root canals, and extractions. Preventive care is also provided. Treatment is provided to both adults and children. Patients are selected via a lottery and there is often a waiting list.   [facility] [address], [place]  [phone] [email or web address]   [facility] [address] [place], [place] [phone], Ext. [phone] Second and Fourth Thursday of each month, opens at 6:30 AM; Clinic ends at 9 AM.  Patients are seen on a first-come first-served basis, and a limited number are seen during each clinic.   [facility]  [address] Hillard Danker [place], [place] [phone]   Eligibility Requirements You must have lived in [place], [place], or [place] counties for at least the last three months.   You cannot be eligible for state or federal sponsored [employer], including [employer], [place], or [employer].   You generally cannot be eligible for healthcare insurance through your employer.    How to apply: Eligibility screenings are held every Tuesday and Wednesday afternoon from 1:00 pm until 4:00 pm. You do not need an appointment for the interview!  [facility] [address], [place], [place]   [place]  [place] Department  [place]  [phone]    Behavioral Health Resources in the Community: Intensive Outpatient Programs Organization         Address  Phone  Notes  [place]. [address] [place], [place] [phone]   [facility] Outpatient [address] [place], [place]   ADS: Alcohol & Drug Svcs [address] [place], [place]   [place] [address]  [place], [place] or [phone]   Substance Abuse Resources Organization         Address  Phone  Notes  Alcohol and Drug Services   [phone]   [place]  [phone]   [facility] [place]   Chinita Pester  [phone]   Residential & Outpatient Substance Abuse Program  [phone]   Psychological Services Organization         Address  Phone  Notes  [facility]  [place]  [phone]   [place] [address], [place] or [phone]    Mobile Crisis Teams Organization  Address  Phone  Notes  [facility]rapeutic Alternatives, Mobile Crisis Care Unit  [phone]   [facility]ive Psychotherapeutic Services  [address] [place], [place]   [facility] [address], [place] [phone]    Self-Help/Support Groups Organization         Address  Phone             Notes  [place]. of [place] - variety of support groups  [place] Call for more information  Narcotics Anonymous (NA), [facility] [address], [employer]   2 meetings at this location   Special educational needs teacher         Address  Phone  Notes  ASAP Residential Treatment [place],    [place]  [phone]   [facility]  [address] [place] [address], [place], [place]   Noel Coryell, [place] [phone] Admissions: 8am-3pm M-F  Incentives Substance [place] [address]    [place], [place] [phone]   [facility] [address] [place], [place], [place]   [facility] [address],  [place], [place]   [facility]ight Programs - Intensive Outpatient [address] Kristeen [address], [place], [place]   [facility] ([place].) [place].,  [place], [place] [phone] or [phone]   [facility] Services (RTS) [address], [place], [place] Accepts Medicaid  [facility] [place] [address]  [place] [phone] Substance Abuse/Addiction Treatment   [facility] Organization         Address  Phone  Notes  [facility]  [phone]   Domenic Schwab, PhD [address] Arlis Porta [place], [place]   [phone] or [phone]   [place], [place] [phone]   [facility] [address] [place], [place] [phone] Insurance/Medicaid/sponsorship through [facility] and Families [address], [address] [place]                                    [place], [place] [phone] [place] [address], [place] [phone]    Dr. Adele Schilder  [phone]   [facility] [place] Dept. 1) [address] [place] 2) [place] 3)  [place] [address], [place] [phone]  [phone]   [place] [phone] or [phone] (After Hours)

## 2014-06-01 NOTE — ED Notes (Signed)
Pt vomited a large amount of clear fluid.  MD notified.

## 2014-06-04 NOTE — ED Provider Notes (Signed)
History/physical exam/procedure(s) were performed by non-physician practitioner and as supervising physician I was immediately available for consultation/collaboration. I have reviewed all notes and am in agreement with care and plan.   Shaune Pollack, MD 06/04/14 [phone]

## 2015-05-19 ENCOUNTER — Emergency Department (HOSPITAL_COMMUNITY)
Admission: EM | Admit: 2015-05-19 | Discharge: 2015-05-19 | Disposition: A | Discharge status: Home / Self Care | Payer: 59 | Attending: Emergency Medicine | Admitting: Emergency Medicine

## 2015-05-19 ENCOUNTER — Encounter (HOSPITAL_COMMUNITY): Payer: Self-pay | Admitting: *Deleted

## 2015-05-19 DIAGNOSIS — R109 Unspecified abdominal pain: Secondary | ICD-10-CM | POA: Insufficient documentation

## 2015-05-19 DIAGNOSIS — R197 Diarrhea, unspecified: Secondary | ICD-10-CM | POA: Insufficient documentation

## 2015-05-19 DIAGNOSIS — R079 Chest pain, unspecified: Secondary | ICD-10-CM | POA: Insufficient documentation

## 2015-05-19 DIAGNOSIS — R61 Generalized hyperhidrosis: Secondary | ICD-10-CM | POA: Insufficient documentation

## 2015-05-19 DIAGNOSIS — Z87891 Personal history of nicotine dependence: Secondary | ICD-10-CM | POA: Insufficient documentation

## 2015-05-19 DIAGNOSIS — R112 Nausea with vomiting, unspecified: Secondary | ICD-10-CM | POA: Insufficient documentation

## 2015-05-19 HISTORY — DX: Vomiting, unspecified: R11.10

## 2015-05-19 LAB — COMPREHENSIVE METABOLIC PANEL
ALT: 61 U/L (ref 17–63)
ANION GAP: 14 (ref 5–15)
AST: 42 U/L — ABNORMAL HIGH (ref 15–41)
Albumin: 4.7 g/dL (ref 3.5–5.0)
Alkaline Phosphatase: 68 U/L (ref 38–126)
BUN: 8 mg/dL (ref 6–20)
CALCIUM: 9.9 mg/dL (ref 8.9–10.3)
CO2: 20 mmol/L — ABNORMAL LOW (ref 22–32)
Chloride: 107 mmol/L (ref 101–111)
Creatinine, Ser: 1.3 mg/dL — ABNORMAL HIGH (ref 0.61–1.24)
GFR calc non Af Amer: >60 mL/min (ref >60)
Glucose, Bld: 156 mg/dL — ABNORMAL HIGH (ref 65–99)
POTASSIUM: 4 mmol/L (ref 3.5–5.1)
Sodium: 141 mmol/L (ref 135–145)
Total Bilirubin: 1.4 mg/dL — ABNORMAL HIGH (ref 0.3–1.2)
Total Protein: 7.7 g/dL (ref 6.5–8.1)

## 2015-05-19 LAB — URINALYSIS, ROUTINE W REFLEX MICROSCOPIC
GLUCOSE, UA: NEGATIVE mg/dL
Hgb urine dipstick: NEGATIVE
Ketones, ur: >80 mg/dL — AB
Nitrite: NEGATIVE
Protein, ur: 30 mg/dL — AB
Specific Gravity, Urine: 1.028 (ref 1.005–1.030)
Urobilinogen, UA: 1 mg/dL (ref 0.0–1.0)
pH: 7 (ref 5.0–8.0)

## 2015-05-19 LAB — URINE MICROSCOPIC-ADD ON

## 2015-05-19 LAB — RAPID URINE DRUG SCREEN, HOSP PERFORMED
AMPHETAMINES: NOT DETECTED
BENZODIAZEPINES: NOT DETECTED
Barbiturates: NOT DETECTED
Cocaine: NOT DETECTED
OPIATES: NOT DETECTED
Tetrahydrocannabinol: POSITIVE — AB

## 2015-05-19 LAB — CBC
HCT: 53.3 % — ABNORMAL HIGH (ref 39.0–52.0)
HEMOGLOBIN: 18.6 g/dL — AB (ref 13.0–17.0)
MCH: 31.2 pg (ref 26.0–34.0)
MCHC: 34.9 g/dL (ref 30.0–36.0)
MCV: 89.4 fL (ref 78.0–100.0)
Platelets: 190 10*3/uL (ref 150–400)
RBC: 5.96 MIL/uL — AB (ref 4.22–5.81)
RDW: 13.4 % (ref 11.5–15.5)
WBC: 12.5 10*3/uL — ABNORMAL HIGH (ref 4.0–10.5)

## 2015-05-19 LAB — LIPASE, BLOOD: LIPASE: 18 U/L — AB (ref 22–51)

## 2015-05-19 MED ORDER — LORAZEPAM 2 MG/ML IJ SOLN
1.0000 mg | Freq: Once | INTRAMUSCULAR | Status: AC
  Administered 2015-05-19: 1 mg via INTRAVENOUS
  Filled 2015-05-19: qty 1

## 2015-05-19 MED ORDER — ONDANSETRON HCL 4 MG PO TABS: 4.0000 mg | ORAL_TABLET | Freq: Four times a day (QID) | ORAL | Status: DC

## 2015-05-19 MED ORDER — ONDANSETRON HCL 4 MG/2ML IJ SOLN
4.0000 mg | Freq: Once | INTRAMUSCULAR | Status: AC | PRN
  Administered 2015-05-19: 4 mg via INTRAVENOUS
  Filled 2015-05-19: qty 2

## 2015-05-19 MED ORDER — LORAZEPAM 2 MG/ML IJ SOLN
1.0000 mg | Freq: Once | INTRAMUSCULAR | Status: AC
Start: 2015-05-19 — End: 2015-05-19
  Administered 2015-05-19: 1 mg via INTRAVENOUS
  Filled 2015-05-19: qty 1

## 2015-05-19 MED ORDER — DIPHENHYDRAMINE HCL 50 MG/ML IJ SOLN
25.0000 mg | Freq: Once | INTRAMUSCULAR | Status: AC
  Administered 2015-05-19: 25 mg via INTRAVENOUS
  Filled 2015-05-19: qty 1

## 2015-05-19 MED ORDER — METOCLOPRAMIDE HCL 5 MG/ML IJ SOLN
5.0000 mg | Freq: Once | INTRAMUSCULAR | Status: AC
  Administered 2015-05-19: 5 mg via INTRAVENOUS
  Filled 2015-05-19: qty 2

## 2015-05-19 NOTE — ED Provider Notes (Signed)
CSN: [id number]     Arrival date & time 05/19/15  1147 History   First MD Initiated Contact with Patient 05/19/15 1151     Chief Complaint  Patient presents with  . Emesis  . Generalized Body Aches   HPI  Steven Black is a 39 year old male presenting with emesis and nausea. States that last night he became extremely nauseous and began vomiting. The nausea has been constant and he has vomited 6-7 times since last evening. Associated with chills, diaphoresis and epigastric stomach pain. He reports that when he is done vomiting he has chest pain. He endorses an episode of diarrhea this morning. No blood in the stool or vomit. Has history of cholecystectomy but no other abdominal history. Denies fevers, headaches, sore throat, SOB, muscle aches or syncope. Denies alcohol or illicit drug use. States his arm gets "pins and needles" when the blood pressure cuff inflates. Feeling resolves quickly.   Past Medical History  Diagnosis Date  . Emesis    Past Surgical History  Procedure Laterality Date  . Cholecystectomy     No family history on file. Social History  Substance Use Topics  . Smoking status: Former Research scientist (life sciences)  . Smokeless tobacco: None  . Alcohol Use: No    Review of Systems  Constitutional: Positive for chills and diaphoresis. Negative for fever.  Respiratory: Negative for shortness of breath.   Cardiovascular: Positive for chest pain.  Gastrointestinal: Positive for nausea, vomiting, abdominal pain and diarrhea. Negative for blood in stool and abdominal distention.  Musculoskeletal: Negative for myalgias and arthralgias.  Neurological: Negative for syncope, weakness, light-headedness and headaches.      Allergies  Review of patient's allergies indicates no known allergies.  Home Medications   Prior to Admission medications   Medication Sig Start Date End Date Taking? Authorizing Provider  ondansetron (ZOFRAN) 4 MG tablet Take 1 tablet (4 mg total) by mouth every 6 (six)  hours. 05/19/15   [name], PA-C  promethazine (PHENERGAN) 25 MG tablet Take 1 tablet (25 mg total) by mouth every 6 (six) hours as needed for nausea or vomiting. Patient not taking: Reported on 05/19/2015 06/01/14   [name], PA-C   BP 150/89 mmHg  Pulse 98  Temp(Src) 97.9 F (36.6 C) (Oral)  Resp 18  Ht 6\' 7"  (2.007 m)  Wt 200 lb (90.719 kg)  BMI 22.52 kg/m2  SpO2 98% Physical Exam  Constitutional: He is oriented to person, place, and time. He appears well-developed and well-nourished. No distress.  HENT:  Head: Normocephalic and atraumatic.  Eyes: Conjunctivae and EOM are normal. Pupils are equal, round, and reactive to light.  Neck: Normal range of motion.  Cardiovascular: Normal rate, regular rhythm and normal heart sounds.   Pulmonary/Chest: Effort normal and breath sounds normal. No respiratory distress. He has no wheezes.  Abdominal: Soft. He exhibits no distension. There is tenderness (generalized). There is no rebound and no guarding.  Neurological: He is alert and oriented to person, place, and time.  Skin: Skin is warm. He is diaphoretic.  Psychiatric: He has a normal mood and affect. His behavior is normal.    ED Course  Procedures (including critical care time) Labs Review Labs Reviewed  LIPASE, BLOOD - Abnormal; Notable for the following:    Lipase 18 (*)    All other components within normal limits  COMPREHENSIVE METABOLIC PANEL - Abnormal; Notable for the following:    CO2 20 (*)    Glucose, Bld 156 (*)  Creatinine, Ser 1.30 (*)    AST 42 (*)    Total Bilirubin 1.4 (*)    All other components within normal limits  CBC - Abnormal; Notable for the following:    WBC 12.5 (*)    RBC 5.96 (*)    Hemoglobin 18.6 (*)    HCT 53.3 (*)    All other components within normal limits  URINALYSIS, ROUTINE W REFLEX MICROSCOPIC (NOT AT [facility]) - Abnormal; Notable for the following:    Color, Urine AMBER (*)    APPearance HAZY (*)    Bilirubin Urine SMALL (*)     Ketones, ur >80 (*)    Protein, ur 30 (*)    Leukocytes, UA TRACE (*)    All other components within normal limits  URINE RAPID DRUG SCREEN, HOSP PERFORMED - Abnormal; Notable for the following:    Tetrahydrocannabinol POSITIVE (*)    All other components within normal limits  URINE MICROSCOPIC-ADD ON - Abnormal; Notable for the following:    Bacteria, UA MANY (*)    All other components within normal limits    Imaging Review No results found. I have personally reviewed and evaluated these images and lab results as part of my medical decision-making.   EKG Interpretation None     Case discussed with Dr. [name]  MDM   Final diagnoses:  Nausea and vomiting, vomiting of unspecified type   Pt presenting with nausea and vomiting x 1 day. Pt has not been able to keep down solids or liquids since last night. Denies fevers but states he gets diaphoretic after vomiting. Endorses episode of diarrhea this morning and chills. Denies alcohol or illicit drug use. Pt with multiple ED visits for emesis in past 2 years.  UDS positive for tetrahydrocannabinol. Given IVF, zofran, ativan x 2, reglan and benadryl in ED. Labs reveal elevated creatinine likely from dehydration. UA noted for many bacteria, will send for culture. Pt not complaining of urinary symptoms. Pt ready for discharge once nausea and vomiting under control and able to PO challenge. Will give home prescription for zofran. Will discuss marijuana use and connection with chronic vomiting. Return precautions given in discharge paperwork. Pt signed out to [name].    [name], PA-C 05/19/15 1806  [name], MD 05/20/15 [phone]

## 2015-05-19 NOTE — Discharge Instructions (Signed)
-   Take zofran as needed for nausea - Keep liquid intake up, solids as tolerated - Tylenol or motrin for pain control - Return to ED with blood in vomit or stool, increasing severity of abdominal pain, fever develops or further worsening of symptoms

## 2015-05-19 NOTE — ED Notes (Signed)
Pt now c/o epigastric and chest pain.

## 2015-05-19 NOTE — ED Provider Notes (Signed)
Care assumed from [place] PA-C at shift change. Pt here with epigastric pain, n/v, has been given ativan, zofran, benadryl/reglan and is currently sleeping. Plan is to await to see if pt awakes and is still vomiting, if able to tolerate PO then can d/c home with zofran. +UDS for [facility] therefore pt needs to be instructed to stop smoking marijuana.   Labs reveal: elevated Cr likely from dehydration. Hemoconcentrated CBC. U/A with bacteria but only 0-2 WBC/hpf, and mucous present, therefore will send for culture.  Physical Exam  BP 115/53 mmHg  Pulse 78  Temp(Src) 97.9 F (36.6 C) (Oral)  Resp 18  Ht 6\' 7"  (2.007 m)  Wt 200 lb (90.719 kg)  BMI 22.52 kg/m2  SpO2 95%  Physical Exam Gen: afebrile, VSS, NAD; asleep but arousable HEENT: EOMI, MMM Resp: no resp distress CV: rate WNL Abd: appearance normal, nondistended MsK: moving all extremities with ease Neuro: A&O x4  ED Course  Procedures Results for orders placed or performed during the hospital encounter of 05/19/15  Lipase, blood  Result Value Ref Range   Lipase 18 (L) 22 - 51 U/L  Comprehensive metabolic panel  Result Value Ref Range   Sodium 141 135 - 145 mmol/L   Potassium 4.0 3.5 - 5.1 mmol/L   Chloride 107 101 - 111 mmol/L   CO2 20 (L) 22 - 32 mmol/L   Glucose, Bld 156 (H) 65 - 99 mg/dL   BUN 8 6 - 20 mg/dL   Creatinine, Ser 1.30 (H) 0.61 - 1.24 mg/dL   Calcium 9.9 8.9 - 10.3 mg/dL   Total Protein 7.7 6.5 - 8.1 g/dL   Albumin 4.7 3.5 - 5.0 g/dL   AST 42 (H) 15 - 41 U/L   ALT 61 17 - 63 U/L   Alkaline Phosphatase 68 38 - 126 U/L   Total Bilirubin 1.4 (H) 0.3 - 1.2 mg/dL   GFR calc non Af Amer >60 >60 mL/min   GFR calc Af Amer >60 >60 mL/min   Anion gap 14 5 - 15  CBC  Result Value Ref Range   WBC 12.5 (H) 4.0 - 10.5 K/uL   RBC 5.96 (H) 4.22 - 5.81 MIL/uL   Hemoglobin 18.6 (H) 13.0 - 17.0 g/dL   HCT 53.3 (H) 39.0 - 52.0 %   MCV 89.4 78.0 - 100.0 fL   MCH 31.2 26.0 - 34.0 pg   MCHC 34.9 30.0 - 36.0 g/dL    RDW 13.4 11.5 - 15.5 %   Platelets 190 150 - 400 K/uL  Urinalysis, Routine w reflex microscopic (not at [facility])  Result Value Ref Range   Color, Urine AMBER (A) YELLOW   APPearance HAZY (A) CLEAR   Specific Gravity, Urine 1.028 1.005 - 1.030   pH 7.0 5.0 - 8.0   Glucose, UA NEGATIVE NEGATIVE mg/dL   Hgb urine dipstick NEGATIVE NEGATIVE   Bilirubin Urine SMALL (A) NEGATIVE   Ketones, ur >80 (A) NEGATIVE mg/dL   Protein, ur 30 (A) NEGATIVE mg/dL   Urobilinogen, UA 1.0 0.0 - 1.0 mg/dL   Nitrite NEGATIVE NEGATIVE   Leukocytes, UA TRACE (A) NEGATIVE  Urine rapid drug screen (hosp performed)  Result Value Ref Range   Opiates NONE DETECTED NONE DETECTED   Cocaine NONE DETECTED NONE DETECTED   Benzodiazepines NONE DETECTED NONE DETECTED   Amphetamines NONE DETECTED NONE DETECTED   Tetrahydrocannabinol POSITIVE (A) NONE DETECTED   Barbiturates NONE DETECTED NONE DETECTED  Urine microscopic-add on  Result Value Ref Range  Squamous Epithelial / LPF RARE RARE   WBC, UA 0-2 <3 WBC/hpf   Bacteria, UA MANY (A) RARE   Urine-Other MUCOUS PRESENT    No results found.   Meds ordered this encounter  Medications  . ondansetron (ZOFRAN) injection 4 mg    Sig:   . LORazepam (ATIVAN) injection 1 mg    Sig:   . metoCLOPramide (REGLAN) injection 5 mg    Sig:   . diphenhydrAMINE (BENADRYL) injection 25 mg    Sig:   . LORazepam (ATIVAN) injection 1 mg    Sig:   . ondansetron (ZOFRAN) 4 MG tablet    Sig: Take 1 tablet (4 mg total) by mouth every 6 (six) hours.    Dispense:  12 tablet    Refill:  0     MDM:   ICD-9-CM ICD-10-CM   1. Nausea and vomiting, vomiting of unspecified type 787.01 R11.2    4:59 PM- pt tolerating PO well, no longer vomiting. Advised pt to stop smoking marijuana. Will d/c with instructions as written by Josephina Gip PA-C   Lieutenant Abarca Camprubi-Soms, PA-C 05/19/15 [place], MD 05/20/15 [phone]

## 2015-05-19 NOTE — ED Notes (Signed)
Pt states acute onset emesis since yesterday.  Diaphoretic and actively vomiting in triage.  Also c/o generalized body aches and extremity numbness.

## 2015-05-20 LAB — URINE CULTURE: Culture: NO GROWTH

## 2017-11-26 ENCOUNTER — Other Ambulatory Visit: Payer: Self-pay

## 2017-11-26 ENCOUNTER — Encounter (HOSPITAL_COMMUNITY): Payer: Self-pay | Admitting: Emergency Medicine

## 2017-11-26 ENCOUNTER — Emergency Department (HOSPITAL_COMMUNITY)
Admission: EM | Admit: 2017-11-26 | Discharge: 2017-11-26 | Disposition: A | Discharge status: Home / Self Care | Payer: Self-pay | Attending: Emergency Medicine | Admitting: Emergency Medicine

## 2017-11-26 DIAGNOSIS — Z5321 Procedure and treatment not carried out due to patient leaving prior to being seen by health care provider: Secondary | ICD-10-CM | POA: Insufficient documentation

## 2017-11-26 DIAGNOSIS — R109 Unspecified abdominal pain: Secondary | ICD-10-CM | POA: Insufficient documentation

## 2017-11-26 LAB — COMPREHENSIVE METABOLIC PANEL
ALBUMIN: 4.9 g/dL (ref 3.5–5.0)
ALT: 48 U/L (ref 17–63)
AST: 45 U/L — AB (ref 15–41)
Alkaline Phosphatase: 63 U/L (ref 38–126)
Anion gap: 17 — ABNORMAL HIGH (ref 5–15)
BUN: 13 mg/dL (ref 6–20)
CO2: 23 mmol/L (ref 22–32)
CREATININE: 1.4 mg/dL — AB (ref 0.61–1.24)
Calcium: 10.4 mg/dL — ABNORMAL HIGH (ref 8.9–10.3)
Chloride: 95 mmol/L — ABNORMAL LOW (ref 101–111)
GFR calc Af Amer: >60 mL/min (ref >60)
GFR calc non Af Amer: >60 mL/min (ref >60)
GLUCOSE: 134 mg/dL — AB (ref 65–99)
Potassium: 3.3 mmol/L — ABNORMAL LOW (ref 3.5–5.1)
Sodium: 135 mmol/L (ref 135–145)
Total Bilirubin: 1.6 mg/dL — ABNORMAL HIGH (ref 0.3–1.2)
Total Protein: 8.3 g/dL — ABNORMAL HIGH (ref 6.5–8.1)

## 2017-11-26 LAB — CBC WITH DIFFERENTIAL/PLATELET
BASOS ABS: 0 10*3/uL (ref 0.0–0.1)
Basophils Relative: 0 %
Eosinophils Absolute: 0 10*3/uL (ref 0.0–0.7)
Eosinophils Relative: 0 %
HCT: 53.9 % — ABNORMAL HIGH (ref 39.0–52.0)
Hemoglobin: 18.6 g/dL — ABNORMAL HIGH (ref 13.0–17.0)
LYMPHS ABS: 3.3 10*3/uL (ref 0.7–4.0)
Lymphocytes Relative: 21 %
MCH: 30.9 pg (ref 26.0–34.0)
MCHC: 34.5 g/dL (ref 30.0–36.0)
MCV: 89.5 fL (ref 78.0–100.0)
MONOS PCT: 8 %
Monocytes Absolute: 1.2 10*3/uL — ABNORMAL HIGH (ref 0.1–1.0)
NEUTROS PCT: 71 %
Neutro Abs: 11.1 10*3/uL — ABNORMAL HIGH (ref 1.7–7.7)
PLATELETS: 194 10*3/uL (ref 150–400)
RBC: 6.02 MIL/uL — AB (ref 4.22–5.81)
RDW: 13.9 % (ref 11.5–15.5)
WBC: 15.6 10*3/uL — AB (ref 4.0–10.5)

## 2017-11-26 LAB — URINALYSIS, ROUTINE W REFLEX MICROSCOPIC
BACTERIA UA: NONE SEEN
Glucose, UA: NEGATIVE mg/dL
KETONES UR: 20 mg/dL — AB
LEUKOCYTES UA: NEGATIVE
Nitrite: NEGATIVE
PROTEIN: 100 mg/dL — AB
SQUAMOUS EPITHELIAL / LPF: NONE SEEN
Specific Gravity, Urine: 1.033 — ABNORMAL HIGH (ref 1.005–1.030)
pH: 5 (ref 5.0–8.0)

## 2017-11-26 LAB — LIPASE, BLOOD: Lipase: 26 U/L (ref 11–51)

## 2017-11-26 NOTE — ED Triage Notes (Addendum)
Patient reports pain across his abdomen onset Monday with emesis and diarrhea , he suspects foot poisoning after eating chicken , denies fever or chills .

## 2017-11-27 ENCOUNTER — Emergency Department (HOSPITAL_COMMUNITY): Payer: Self-pay

## 2017-11-27 ENCOUNTER — Emergency Department (HOSPITAL_COMMUNITY)
Admission: EM | Admit: 2017-11-27 | Discharge: 2017-11-27 | Disposition: A | Discharge status: Home / Self Care | Payer: Self-pay | Attending: Emergency Medicine | Admitting: Emergency Medicine

## 2017-11-27 ENCOUNTER — Encounter (HOSPITAL_COMMUNITY): Payer: Self-pay

## 2017-11-27 ENCOUNTER — Other Ambulatory Visit: Payer: Self-pay

## 2017-11-27 DIAGNOSIS — R197 Diarrhea, unspecified: Secondary | ICD-10-CM | POA: Diagnosis not present

## 2017-11-27 DIAGNOSIS — Z87891 Personal history of nicotine dependence: Secondary | ICD-10-CM | POA: Insufficient documentation

## 2017-11-27 DIAGNOSIS — R112 Nausea with vomiting, unspecified: Secondary | ICD-10-CM | POA: Insufficient documentation

## 2017-11-27 DIAGNOSIS — T50904A Poisoning by unspecified drugs, medicaments and biological substances, undetermined, initial encounter: Secondary | ICD-10-CM | POA: Diagnosis not present

## 2017-11-27 DIAGNOSIS — Z9049 Acquired absence of other specified parts of digestive tract: Secondary | ICD-10-CM | POA: Insufficient documentation

## 2017-11-27 DIAGNOSIS — R1013 Epigastric pain: Secondary | ICD-10-CM | POA: Insufficient documentation

## 2017-11-27 DIAGNOSIS — R1110 Vomiting, unspecified: Secondary | ICD-10-CM

## 2017-11-27 LAB — CBC
HEMATOCRIT: 56 % — AB (ref 39.0–52.0)
Hemoglobin: 19.4 g/dL — ABNORMAL HIGH (ref 13.0–17.0)
MCH: 30.5 pg (ref 26.0–34.0)
MCHC: 34.6 g/dL (ref 30.0–36.0)
MCV: 88.1 fL (ref 78.0–100.0)
Platelets: 217 10*3/uL (ref 150–400)
RBC: 6.36 MIL/uL — AB (ref 4.22–5.81)
RDW: 13.8 % (ref 11.5–15.5)
WBC: 23.2 10*3/uL — AB (ref 4.0–10.5)

## 2017-11-27 LAB — RAPID URINE DRUG SCREEN, HOSP PERFORMED
AMPHETAMINES: NOT DETECTED
Barbiturates: NOT DETECTED
Benzodiazepines: NOT DETECTED
Cocaine: NOT DETECTED
Opiates: NOT DETECTED
Tetrahydrocannabinol: POSITIVE — AB

## 2017-11-27 LAB — URINALYSIS, ROUTINE W REFLEX MICROSCOPIC
BILIRUBIN URINE: NEGATIVE
Glucose, UA: NEGATIVE mg/dL
Ketones, ur: 20 mg/dL — AB
LEUKOCYTES UA: NEGATIVE
NITRITE: NEGATIVE
PROTEIN: 30 mg/dL — AB
Specific Gravity, Urine: 1.031 — ABNORMAL HIGH (ref 1.005–1.030)
Squamous Epithelial / LPF: NONE SEEN
pH: 6 (ref 5.0–8.0)

## 2017-11-27 LAB — COMPREHENSIVE METABOLIC PANEL
ALBUMIN: 5.1 g/dL — AB (ref 3.5–5.0)
ALT: 54 U/L (ref 17–63)
ANION GAP: 16 — AB (ref 5–15)
AST: 58 U/L — ABNORMAL HIGH (ref 15–41)
Alkaline Phosphatase: 73 U/L (ref 38–126)
BILIRUBIN TOTAL: 1.4 mg/dL — AB (ref 0.3–1.2)
BUN: 18 mg/dL (ref 6–20)
CO2: 23 mmol/L (ref 22–32)
Calcium: 10.8 mg/dL — ABNORMAL HIGH (ref 8.9–10.3)
Chloride: 99 mmol/L — ABNORMAL LOW (ref 101–111)
Creatinine, Ser: 1.4 mg/dL — ABNORMAL HIGH (ref 0.61–1.24)
Glucose, Bld: 157 mg/dL — ABNORMAL HIGH (ref 65–99)
POTASSIUM: 3.7 mmol/L (ref 3.5–5.1)
Sodium: 138 mmol/L (ref 135–145)
TOTAL PROTEIN: 8.8 g/dL — AB (ref 6.5–8.1)

## 2017-11-27 LAB — RAPID STREP SCREEN (MED CTR MEBANE ONLY): STREPTOCOCCUS, GROUP A SCREEN (DIRECT): NEGATIVE

## 2017-11-27 LAB — LIPASE, BLOOD: Lipase: 25 U/L (ref 11–51)

## 2017-11-27 MED ORDER — HALOPERIDOL LACTATE 5 MG/ML IJ SOLN
5.0000 mg | Freq: Once | INTRAMUSCULAR | Status: AC
  Administered 2017-11-27: 5 mg via INTRAVENOUS
  Filled 2017-11-27: qty 1

## 2017-11-27 MED ORDER — SODIUM CHLORIDE 0.9 % IV BOLUS (SEPSIS)
1000.0000 mL | Freq: Once | INTRAVENOUS | Status: AC
  Administered 2017-11-27: 1000 mL via INTRAVENOUS

## 2017-11-27 MED ORDER — ONDANSETRON 4 MG PO TBDP
4.0000 mg | ORAL_TABLET | Freq: Once | ORAL | Status: AC | PRN
  Administered 2017-11-27: 4 mg via ORAL
  Filled 2017-11-27: qty 1

## 2017-11-27 MED ORDER — DICYCLOMINE HCL 10 MG/ML IM SOLN
20.0000 mg | Freq: Once | INTRAMUSCULAR | Status: AC
  Administered 2017-11-27: 20 mg via INTRAMUSCULAR
  Filled 2017-11-27: qty 2

## 2017-11-27 MED ORDER — PROMETHAZINE HCL 25 MG/ML IJ SOLN
25.0000 mg | Freq: Once | INTRAMUSCULAR | Status: DC
  Filled 2017-11-27: qty 1

## 2017-11-27 NOTE — ED Triage Notes (Signed)
Patient states abdominal pain and vomiting since Monday after eating chicken. Patient states he was at [facility] and left due to wait. Patient states vomiting-initial diarrhea but none at present time.Patient states pain across upper abdomen.

## 2017-11-27 NOTE — Discharge Instructions (Signed)
It was my pleasure taking care of you today!   Zofran as needed for nausea. Bentyl as needed for abdominal cramping.   Increase hydration.   Try to stop smoking marijuana. This can often cause or worsen nausea and vomiting. See attachment for further information about this.   Return to ER for new or worsening symptoms, any additional concerns.

## 2017-11-27 NOTE — ED Triage Notes (Signed)
Patient states he also has a sore throat and headache

## 2017-11-27 NOTE — ED Notes (Signed)
Pt given sprite and urinal, told to press call bell when he has urine sample.

## 2017-11-27 NOTE — ED Provider Notes (Signed)
[place] DEPT Provider Note   CSN: [id number] Arrival date & time: 11/27/17  0053     History   Chief Complaint Chief Complaint  Patient presents with  . Abdominal Pain  . Emesis    HPI Eloy Fehl is a 42 y.o. male.  The history is provided by the patient and medical records. No language interpreter was used.  Abdominal Pain   Associated symptoms include diarrhea, nausea and vomiting. Pertinent negatives include constipation.  Emesis   Associated symptoms include abdominal pain and diarrhea.   Tyjon Bowen is a 42 y.o. male who presents to the Emergency Department complaining of nausea and vomiting which began 2 days ago. She reports eating a chicken salad wrap then within 30 minutes, began vomiting.  Nobody else ate same food.  He endorses associated epigastric abdominal pain.  Also notes one, maybe 2 loose stools-no blood noted.  No fever or chills.  No chest pain or trouble breathing.  Tried taking Zofran at home with some relief.  He has had his gallbladder removed.   Past Medical History:  Diagnosis Date  . Emesis     There are no active problems to display for this patient.   Past Surgical History:  Procedure Laterality Date  . CHOLECYSTECTOMY         Home Medications    Prior to Admission medications   Medication Sig Start Date End Date Taking? Authorizing Provider  ondansetron (ZOFRAN) 4 MG tablet Take 1 tablet (4 mg total) by mouth every 6 (six) hours. Patient not taking: Reported on 11/27/2017 05/19/15   Barrett, Lahoma Crocker, PA-C  promethazine (PHENERGAN) 25 MG tablet Take 1 tablet (25 mg total) by mouth every 6 (six) hours as needed for nausea or vomiting. Patient not taking: Reported on 05/19/2015 06/01/14   Pisciotta, Charna Elizabeth    Family History No family history on file.  Social History Social History   Tobacco Use  . Smoking status: Former Research scientist (life sciences)  . Smokeless tobacco: Never Used  Substance Use Topics  . Alcohol  use: No  . Drug use: Yes    Types: Marijuana     Allergies   Patient has no known allergies.   Review of Systems Review of Systems  Gastrointestinal: Positive for abdominal pain, diarrhea, nausea and vomiting. Negative for constipation.  All other systems reviewed and are negative.    Physical Exam Updated Vital Signs BP (!) 153/75   Pulse (!) 57   Temp 98.7 F (37.1 C) (Oral)   Resp 18   Ht 6\' 7"  (2.007 m)   Wt 99.8 kg (220 lb)   SpO2 91%   BMI 24.78 kg/m   Physical Exam  Constitutional: He is oriented to person, place, and time. He appears well-developed and well-nourished. No distress.  Non-toxic appearing.  HENT:  Head: Normocephalic and atraumatic.  Cardiovascular: Normal rate, regular rhythm and normal heart sounds.  No murmur heard. Pulmonary/Chest: Effort normal and breath sounds normal. No respiratory distress.  Abdominal: Soft. Bowel sounds are normal. He exhibits no distension.  No abdominal tenderness.  Neurological: He is alert and oriented to person, place, and time.  Skin: Skin is warm and dry.  Nursing note and vitals reviewed.    ED Treatments / Results  Labs (all labs ordered are listed, but only abnormal results are displayed) Labs Reviewed  COMPREHENSIVE METABOLIC PANEL - Abnormal; Notable for the following components:      Result Value   Chloride 99 (*)  Glucose, Bld 157 (*)    Creatinine, Ser 1.40 (*)    Calcium 10.8 (*)    Total Protein 8.8 (*)    Albumin 5.1 (*)    AST 58 (*)    Total Bilirubin 1.4 (*)    Anion gap 16 (*)    All other components within normal limits  CBC - Abnormal; Notable for the following components:   WBC 23.2 (*)    RBC 6.36 (*)    Hemoglobin 19.4 (*)    HCT 56.0 (*)    All other components within normal limits  URINALYSIS, ROUTINE W REFLEX MICROSCOPIC - Abnormal; Notable for the following components:   Specific Gravity, Urine 1.031 (*)    Hgb urine dipstick SMALL (*)    Ketones, ur 20 (*)     Protein, ur 30 (*)    Bacteria, UA RARE (*)    All other components within normal limits  RAPID URINE DRUG SCREEN, HOSP PERFORMED - Abnormal; Notable for the following components:   Tetrahydrocannabinol POSITIVE (*)    All other components within normal limits  RAPID STREP SCREEN (NOT AT [facility])  CULTURE, GROUP A STREP [facility])  LIPASE, BLOOD    EKG  EKG Interpretation  Date/Time:  Wednesday November 27 2017 06:21:11 EDT Ventricular Rate:  82 PR Interval:    QRS Duration: 91 QT Interval:  401 QTC Calculation: 469 R Axis:   82 Text Interpretation:  Sinus rhythm Confirmed by Randal Buba, April ([id number]) on 11/27/2017 6:24:00 AM Also confirmed by Randal Buba, April ([id number]), editor Philomena Doheny [phone])  on 11/27/2017 8:11:46 AM       Radiology Dg Abd Acute W/chest  Result Date: 11/27/2017 CLINICAL DATA:  Acute onset of moderate to severe mid abdominal pain, nausea and vomiting. EXAM: DG ABDOMEN ACUTE W/ 1V CHEST COMPARISON:  Chest and abdominal radiographs performed 06/01/2014 FINDINGS: The lungs are well-aerated and clear. There is no evidence of focal opacification, pleural effusion or pneumothorax. The cardiomediastinal silhouette is within normal limits. The visualized bowel gas pattern is unremarkable. Scattered stool and air are seen within the colon; there is no evidence of small bowel dilatation to suggest obstruction. No free intra-abdominal air is identified on the provided upright view. Clips are noted within the right upper quadrant, reflecting prior cholecystectomy. No acute osseous abnormalities are seen; the sacroiliac joints are unremarkable in appearance. IMPRESSION: 1. Unremarkable bowel gas pattern; no free intra-abdominal air seen. Small amount of stool noted in the colon. 2. No acute cardiopulmonary process seen. Electronically Signed   By: Garald Balding M.D.   On: 11/27/2017 06:39    Procedures Procedures (including critical care time)  Medications Ordered in ED Medications    ondansetron (ZOFRAN-ODT) disintegrating tablet 4 mg (4 mg Oral Given 11/27/17 0309)  sodium chloride 0.9 % bolus 1,000 mL (0 mLs Intravenous Stopped 11/27/17 0928)  dicyclomine (BENTYL) injection 20 mg (20 mg Intramuscular Given 11/27/17 0643)  haloperidol lactate (HALDOL) injection 5 mg (5 mg Intravenous Given 11/27/17 0642)  sodium chloride 0.9 % bolus 1,000 mL (1,000 mLs Intravenous New Bag/Given 11/27/17 0928)     Initial Impression / Assessment and Plan / ED Course  I have reviewed the triage vital signs and the nursing notes.  Pertinent labs & imaging results that were available during my care of the patient were reviewed by me and considered in my medical decision making (see chart for details).    Candido Flott is a 42 y.o. male who presents to ED for nausea and vomiting.  Reports epigastric abdominal pain as well.  On exam, patient is afebrile, hemodynamically stable and nontoxic-appearing.  He has a benign abdominal exam.  He is complaining of epigastric pain, n/v.  He has had a prior cholecystectomy.  Labs reviewed with attending, Dr. Nicholes Stairs, including leukocytosis of 23.  Abdominal series unremarkable. Given symptomatic medication / IV hydration. On re-evaluation, patient feels improved. Repeat abdominal exam benign. UDS is + for THC. Discussed possibility that this is contributing. He is tolerating PO without difficulty. Evaluation does not show pathology that would require ongoing emergent intervention or inpatient treatment, however reasons to return to ER were discussed at length. All questions answered.   Patient discussed with Dr. Randal Buba who agrees with treatment plan.    Final Clinical Impressions(s) / ED Diagnoses   Final diagnoses:  Vomiting  Non-intractable vomiting with nausea, unspecified vomiting type    ED Discharge Orders    None       Quillan Whitter, Ozella Almond, PA-C 11/27/17 1015    Palumbo, April, MD 11/28/17 0007

## 2017-11-29 LAB — CULTURE, GROUP A STREP (THRC)

## 2018-05-13 ENCOUNTER — Emergency Department (HOSPITAL_COMMUNITY): Payer: 59

## 2018-05-13 ENCOUNTER — Encounter (HOSPITAL_COMMUNITY): Payer: Self-pay | Admitting: Emergency Medicine

## 2018-05-13 ENCOUNTER — Other Ambulatory Visit: Payer: Self-pay

## 2018-05-13 ENCOUNTER — Emergency Department (HOSPITAL_COMMUNITY)
Admission: EM | Admit: 2018-05-13 | Discharge: 2018-05-14 | Disposition: A | Discharge status: Home / Self Care | Payer: 59 | Attending: Emergency Medicine | Admitting: Emergency Medicine

## 2018-05-13 DIAGNOSIS — Z87891 Personal history of nicotine dependence: Secondary | ICD-10-CM | POA: Insufficient documentation

## 2018-05-13 DIAGNOSIS — R112 Nausea with vomiting, unspecified: Secondary | ICD-10-CM

## 2018-05-13 DIAGNOSIS — R1012 Left upper quadrant pain: Secondary | ICD-10-CM | POA: Insufficient documentation

## 2018-05-13 DIAGNOSIS — R110 Nausea: Secondary | ICD-10-CM | POA: Diagnosis not present

## 2018-05-13 DIAGNOSIS — R1110 Vomiting, unspecified: Secondary | ICD-10-CM | POA: Diagnosis not present

## 2018-05-13 DIAGNOSIS — R0789 Other chest pain: Secondary | ICD-10-CM | POA: Diagnosis not present

## 2018-05-13 DIAGNOSIS — R079 Chest pain, unspecified: Secondary | ICD-10-CM | POA: Diagnosis not present

## 2018-05-13 DIAGNOSIS — R918 Other nonspecific abnormal finding of lung field: Secondary | ICD-10-CM | POA: Diagnosis not present

## 2018-05-13 LAB — COMPREHENSIVE METABOLIC PANEL
ALT: 30 U/L (ref 0–44)
AST: 28 U/L (ref 15–41)
Albumin: 4.7 g/dL (ref 3.5–5.0)
Alkaline Phosphatase: 58 U/L (ref 38–126)
Anion gap: 13 (ref 5–15)
BILIRUBIN TOTAL: 1.4 mg/dL — AB (ref 0.3–1.2)
BUN: 10 mg/dL (ref 6–20)
CHLORIDE: 104 mmol/L (ref 98–111)
CO2: 23 mmol/L (ref 22–32)
CREATININE: 1.21 mg/dL (ref 0.61–1.24)
Calcium: 9.5 mg/dL (ref 8.9–10.3)
Glucose, Bld: 127 mg/dL — ABNORMAL HIGH (ref 70–99)
POTASSIUM: 3.2 mmol/L — AB (ref 3.5–5.1)
Sodium: 140 mmol/L (ref 135–145)
TOTAL PROTEIN: 7.4 g/dL (ref 6.5–8.1)

## 2018-05-13 LAB — CBC
HCT: 54.2 % — ABNORMAL HIGH (ref 39.0–52.0)
Hemoglobin: 17.6 g/dL — ABNORMAL HIGH (ref 13.0–17.0)
MCH: 30.1 pg (ref 26.0–34.0)
MCHC: 32.5 g/dL (ref 30.0–36.0)
MCV: 92.8 fL (ref 78.0–100.0)
Platelets: 211 10*3/uL (ref 150–400)
RBC: 5.84 MIL/uL — ABNORMAL HIGH (ref 4.22–5.81)
RDW: 14.1 % (ref 11.5–15.5)
WBC: 13.9 10*3/uL — ABNORMAL HIGH (ref 4.0–10.5)

## 2018-05-13 LAB — I-STAT TROPONIN, ED: TROPONIN I, POC: 0 ng/mL (ref 0.00–0.08)

## 2018-05-13 LAB — LIPASE, BLOOD: Lipase: 31 U/L (ref 11–51)

## 2018-05-13 MED ORDER — IOPAMIDOL (ISOVUE-300) INJECTION 61%
100.0000 mL | Freq: Once | INTRAVENOUS | Status: AC | PRN
  Administered 2018-05-13: 100 mL via INTRAVENOUS

## 2018-05-13 MED ORDER — ONDANSETRON HCL 4 MG/2ML IJ SOLN
4.0000 mg | Freq: Once | INTRAMUSCULAR | Status: AC
  Administered 2018-05-13: 4 mg via INTRAVENOUS
  Filled 2018-05-13: qty 2

## 2018-05-13 MED ORDER — METOCLOPRAMIDE HCL 5 MG/ML IJ SOLN
10.0000 mg | Freq: Once | INTRAMUSCULAR | Status: AC
  Administered 2018-05-14: 10 mg via INTRAVENOUS
  Filled 2018-05-13: qty 2

## 2018-05-13 MED ORDER — LACTATED RINGERS IV BOLUS
1000.0000 mL | Freq: Once | INTRAVENOUS | Status: AC
  Administered 2018-05-13: 1000 mL via INTRAVENOUS

## 2018-05-13 MED ORDER — MORPHINE SULFATE (PF) 4 MG/ML IV SOLN
8.0000 mg | Freq: Once | INTRAVENOUS | Status: AC
  Administered 2018-05-13: 8 mg via INTRAVENOUS
  Filled 2018-05-13: qty 2

## 2018-05-13 MED ORDER — IOPAMIDOL (ISOVUE-300) INJECTION 61%
INTRAVENOUS | Status: AC
  Filled 2018-05-13: qty 100

## 2018-05-13 MED ORDER — DIPHENHYDRAMINE HCL 50 MG/ML IJ SOLN
12.5000 mg | Freq: Once | INTRAMUSCULAR | Status: AC
  Administered 2018-05-14: 12.5 mg via INTRAVENOUS
  Filled 2018-05-13: qty 1

## 2018-05-13 MED ORDER — POTASSIUM CHLORIDE CRYS ER 20 MEQ PO TBCR
40.0000 meq | EXTENDED_RELEASE_TABLET | Freq: Once | ORAL | Status: AC
  Administered 2018-05-13: 40 meq via ORAL
  Filled 2018-05-13: qty 2

## 2018-05-13 MED ORDER — IBUPROFEN 400 MG PO TABS
400.0000 mg | ORAL_TABLET | Freq: Once | ORAL | Status: AC | PRN
  Administered 2018-05-13: 400 mg via ORAL
  Filled 2018-05-13: qty 1

## 2018-05-13 NOTE — ED Provider Notes (Signed)
Assumed care from Dr. Regenia Skeeter.  See prior notes for full H&P.  Patient with significant nausea/vomiting, retching.  Some streaks of blood noted in it here.  Seen in March for similar, likely hyperemesis.  Denies smoking marijuana in the past 3 weeks.  Labs and imaging overall reassuring, suspect small mallory weiss tear from retching as etiology of hematemesis.  Is clinically dry, IVF infusing.  Plan:   IVF, anti-emetics. PO K+ with fluid challenge.  Likely discharge.  Results for orders placed or performed during the hospital encounter of 05/13/18  CBC  Result Value Ref Range   WBC 13.9 (H) 4.0 - 10.5 K/uL   RBC 5.84 (H) 4.22 - 5.81 MIL/uL   Hemoglobin 17.6 (H) 13.0 - 17.0 g/dL   HCT 54.2 (H) 39.0 - 52.0 %   MCV 92.8 78.0 - 100.0 fL   MCH 30.1 26.0 - 34.0 pg   MCHC 32.5 30.0 - 36.0 g/dL   RDW 14.1 11.5 - 15.5 %   Platelets 211 150 - 400 K/uL  Comprehensive metabolic panel  Result Value Ref Range   Sodium 140 135 - 145 mmol/L   Potassium 3.2 (L) 3.5 - 5.1 mmol/L   Chloride 104 98 - 111 mmol/L   CO2 23 22 - 32 mmol/L   Glucose, Bld 127 (H) 70 - 99 mg/dL   BUN 10 6 - 20 mg/dL   Creatinine, Ser 1.21 0.61 - 1.24 mg/dL   Calcium 9.5 8.9 - 10.3 mg/dL   Total Protein 7.4 6.5 - 8.1 g/dL   Albumin 4.7 3.5 - 5.0 g/dL   AST 28 15 - 41 U/L   ALT 30 0 - 44 U/L   Alkaline Phosphatase 58 38 - 126 U/L   Total Bilirubin 1.4 (H) 0.3 - 1.2 mg/dL   GFR calc non Af Amer >60 >60 mL/min   GFR calc Af Amer >60 >60 mL/min   Anion gap 13 5 - 15  Lipase, blood  Result Value Ref Range   Lipase 31 11 - 51 U/L  I-stat troponin, ED  Result Value Ref Range   Troponin i, poc 0.00 0.00 - 0.08 ng/mL   Comment 3           Dg Chest 2 View  Result Date: 05/13/2018 CLINICAL DATA:  Nausea and vomiting with chest pain EXAM: CHEST - 2 VIEW COMPARISON:  11/27/2017 FINDINGS: There is increased interstitial opacity. Cardiomediastinal contours are normal. No focal consolidation. No pleural effusion or pneumothorax.  IMPRESSION: Mild interstitial opacities possibly indicating mild pulmonary edema. No focal consolidation. Electronically Signed   By: Ulyses Jarred M.D.   On: 05/13/2018 17:22   Ct Chest W Contrast  Result Date: 05/13/2018 CLINICAL DATA:  Abdominal pain with nausea and vomiting. Chest pain radiating to left arm. Left lower quadrant pain. EXAM: CT CHEST, ABDOMEN, AND PELVIS WITH CONTRAST TECHNIQUE: Multidetector CT imaging of the chest, abdomen and pelvis was performed following the standard protocol during bolus administration of intravenous contrast. CONTRAST:  143mL ISOVUE-300 IOPAMIDOL (ISOVUE-300) INJECTION 61% COMPARISON:  CT abdomen pelvis 07/22/2013 FINDINGS: CT CHEST FINDINGS Cardiovascular: Heart size is normal without pericardial effusion. The thoracic aorta is normal in course and caliber without dissection, aneurysm, ulceration or intramural hematoma. Mediastinum/Nodes: No mediastinal hematoma. No mediastinal, hilar or axillary lymphadenopathy. The visualized thyroid and thoracic esophageal course are unremarkable. Lungs/Pleura: No pulmonary contusion, pneumothorax or pleural effusion. The central airways are clear. Musculoskeletal: No focal osseous lesion of the ribs, sternum for the visible portions of clavicles  and scapulae. CT ABDOMEN PELVIS FINDINGS Hepatobiliary: No hepatic hematoma or laceration. No biliary dilatation. Unchanged 11 mm hypoattenuating focus at the anterior margin of the liver. Status post cholecystectomy. Pancreas: Normal contours without ductal dilatation. No peripancreatic fluid collection. Spleen: No splenic laceration or hematoma. Adrenals/Urinary Tract: --Adrenal glands: No adrenal hemorrhage. --Right kidney/ureter: No hydronephrosis or perinephric hematoma. --Left kidney/ureter: No hydronephrosis or perinephric hematoma. --Urinary bladder: Unremarkable. Stomach/Bowel: --Stomach/Duodenum: No hiatal hernia or other gastric abnormality. Normal duodenal course and caliber.  --Small bowel: No dilatation or inflammation. --Colon: No focal abnormality. --Appendix: Normal. Vascular/Lymphatic: Normal course and caliber of the major abdominal vessels. No abdominal or pelvic lymphadenopathy. Reproductive: Normal prostate and seminal vesicles. Musculoskeletal. No focal osseous lesions of the pelvis or lumbosacral spine. Other: None. IMPRESSION: No acute abnormality of the chest, abdomen or pelvis. Electronically Signed   By: Ulyses Jarred M.D.   On: 05/13/2018 21:48   Ct Abdomen Pelvis W Contrast  Result Date: 05/13/2018 CLINICAL DATA:  Abdominal pain with nausea and vomiting. Chest pain radiating to left arm. Left lower quadrant pain. EXAM: CT CHEST, ABDOMEN, AND PELVIS WITH CONTRAST TECHNIQUE: Multidetector CT imaging of the chest, abdomen and pelvis was performed following the standard protocol during bolus administration of intravenous contrast. CONTRAST:  125mL ISOVUE-300 IOPAMIDOL (ISOVUE-300) INJECTION 61% COMPARISON:  CT abdomen pelvis 07/22/2013 FINDINGS: CT CHEST FINDINGS Cardiovascular: Heart size is normal without pericardial effusion. The thoracic aorta is normal in course and caliber without dissection, aneurysm, ulceration or intramural hematoma. Mediastinum/Nodes: No mediastinal hematoma. No mediastinal, hilar or axillary lymphadenopathy. The visualized thyroid and thoracic esophageal course are unremarkable. Lungs/Pleura: No pulmonary contusion, pneumothorax or pleural effusion. The central airways are clear. Musculoskeletal: No focal osseous lesion of the ribs, sternum for the visible portions of clavicles and scapulae. CT ABDOMEN PELVIS FINDINGS Hepatobiliary: No hepatic hematoma or laceration. No biliary dilatation. Unchanged 11 mm hypoattenuating focus at the anterior margin of the liver. Status post cholecystectomy. Pancreas: Normal contours without ductal dilatation. No peripancreatic fluid collection. Spleen: No splenic laceration or hematoma. Adrenals/Urinary  Tract: --Adrenal glands: No adrenal hemorrhage. --Right kidney/ureter: No hydronephrosis or perinephric hematoma. --Left kidney/ureter: No hydronephrosis or perinephric hematoma. --Urinary bladder: Unremarkable. Stomach/Bowel: --Stomach/Duodenum: No hiatal hernia or other gastric abnormality. Normal duodenal course and caliber. --Small bowel: No dilatation or inflammation. --Colon: No focal abnormality. --Appendix: Normal. Vascular/Lymphatic: Normal course and caliber of the major abdominal vessels. No abdominal or pelvic lymphadenopathy. Reproductive: Normal prostate and seminal vesicles. Musculoskeletal. No focal osseous lesions of the pelvis or lumbosacral spine. Other: None. IMPRESSION: No acute abnormality of the chest, abdomen or pelvis. Electronically Signed   By: Ulyses Jarred M.D.   On: 05/13/2018 21:48   Patient with some nausea after taking PO K+, no true emesis.  Symptoms resolved with dose of reglan here.  Has not vomited for the past 2 hours.  Feel he is stable for discharge.  Will have him continue PRN zofran or phenergan suppository at home.  Lots of fluids, gentle diet for now.  Follow-up with PCP.  Return here for any new/acute changes.     Larene Pickett, PA-C 05/14/18 7939    Dorie Rank, MD 05/14/18 [phone]

## 2018-05-13 NOTE — ED Triage Notes (Addendum)
Pt reports left sided cp that radiates down his left arm and to LLQ that started an hour ago. Pt reports nausea with vomiting the last week that kept him home from work. Denies diarrhea. Tried zofran yesterday without any relief, has not taken anything for pain. Hx of cholecystectomy.

## 2018-05-13 NOTE — ED Provider Notes (Signed)
[facility] Provider Note   CSN: [id number] Arrival date & time: 05/13/18  1639     History   Chief Complaint Chief Complaint  Patient presents with  . Chest Pain  . Nausea    HPI Steven Black is a 42 y.o. male.  HPI  42 year old male presents with vomiting, chest pain, abdominal pain.  Started having vomiting last week for a couple days and then felt better over the weekend.  Yesterday started having vomiting and left-sided upper abdominal pain.  Today started having chest pain around noon and has had continued vomiting.  Started having vomiting blood.  Chest pain seems better at this moment.  He does not think the chest pain started right after vomiting.  Felt similar but without the chest pain back in March when he went to [place] [facility].  He was told that the vomiting was due to marijuana.  He states he does not use marijuana anymore, then tells me he last used 3 weeks ago. No diarrhea.  Past Medical History:  Diagnosis Date  . Emesis     There are no active problems to display for this patient.   Past Surgical History:  Procedure Laterality Date  . CHOLECYSTECTOMY          Home Medications    Prior to Admission medications   Medication Sig Start Date End Date Taking? Authorizing Provider  ondansetron (ZOFRAN) 4 MG tablet Take 1 tablet (4 mg total) by mouth every 6 (six) hours. Patient not taking: Reported on 11/27/2017 05/19/15   Barrett, Lahoma Crocker, PA-C  promethazine (PHENERGAN) 25 MG tablet Take 1 tablet (25 mg total) by mouth every 6 (six) hours as needed for nausea or vomiting. Patient not taking: Reported on 05/19/2015 06/01/14   Pisciotta, Charna Elizabeth    Family History No family history on file.  Social History Social History   Tobacco Use  . Smoking status: Former Research scientist (life sciences)  . Smokeless tobacco: Never Used  . Tobacco comment: vape with nicotine  Substance Use Topics  . Alcohol use: No  . Drug use: Yes    Types:  Marijuana     Allergies   Patient has no known allergies.   Review of Systems Review of Systems   Physical Exam Updated Vital Signs BP 140/89   Pulse 63   Temp 97.8 F (36.6 C) (Oral)   Resp (!) 9   Ht 6\' 7"  (2.007 m)   Wt 99.8 kg   SpO2 96%   BMI 24.78 kg/m   Physical Exam   ED Treatments / Results  Labs (all labs ordered are listed, but only abnormal results are displayed) Labs Reviewed  CBC - Abnormal; Notable for the following components:      Result Value   WBC 13.9 (*)    RBC 5.84 (*)    Hemoglobin 17.6 (*)    HCT 54.2 (*)    All other components within normal limits  COMPREHENSIVE METABOLIC PANEL - Abnormal; Notable for the following components:   Potassium 3.2 (*)    Glucose, Bld 127 (*)    Total Bilirubin 1.4 (*)    All other components within normal limits  LIPASE, BLOOD  I-STAT TROPONIN, ED    EKG EKG Interpretation  Date/Time:  Tuesday May 13 2018 16:41:15 EDT Ventricular Rate:  86 PR Interval:  122 QRS Duration: 92 QT Interval:  368 QTC Calculation: 440 R Axis:   80 Text Interpretation:  Normal sinus rhythm Right atrial enlargement Minimal  voltage criteria for LVH, may be normal variant Nonspecific ST abnormality Abnormal ECG similar to Mar 2019 Confirmed by Sherwood Gambler [phone]) on 05/13/2018 8:47:14 PM   Radiology Dg Chest 2 View  Result Date: 05/13/2018 CLINICAL DATA:  Nausea and vomiting with chest pain EXAM: CHEST - 2 VIEW COMPARISON:  11/27/2017 FINDINGS: There is increased interstitial opacity. Cardiomediastinal contours are normal. No focal consolidation. No pleural effusion or pneumothorax. IMPRESSION: Mild interstitial opacities possibly indicating mild pulmonary edema. No focal consolidation. Electronically Signed   By: Ulyses Jarred M.D.   On: 05/13/2018 17:22   Ct Chest W Contrast  Result Date: 05/13/2018 CLINICAL DATA:  Abdominal pain with nausea and vomiting. Chest pain radiating to left arm. Left lower quadrant pain.  EXAM: CT CHEST, ABDOMEN, AND PELVIS WITH CONTRAST TECHNIQUE: Multidetector CT imaging of the chest, abdomen and pelvis was performed following the standard protocol during bolus administration of intravenous contrast. CONTRAST:  12mL ISOVUE-300 IOPAMIDOL (ISOVUE-300) INJECTION 61% COMPARISON:  CT abdomen pelvis 07/22/2013 FINDINGS: CT CHEST FINDINGS Cardiovascular: Heart size is normal without pericardial effusion. The thoracic aorta is normal in course and caliber without dissection, aneurysm, ulceration or intramural hematoma. Mediastinum/Nodes: No mediastinal hematoma. No mediastinal, hilar or axillary lymphadenopathy. The visualized thyroid and thoracic esophageal course are unremarkable. Lungs/Pleura: No pulmonary contusion, pneumothorax or pleural effusion. The central airways are clear. Musculoskeletal: No focal osseous lesion of the ribs, sternum for the visible portions of clavicles and scapulae. CT ABDOMEN PELVIS FINDINGS Hepatobiliary: No hepatic hematoma or laceration. No biliary dilatation. Unchanged 11 mm hypoattenuating focus at the anterior margin of the liver. Status post cholecystectomy. Pancreas: Normal contours without ductal dilatation. No peripancreatic fluid collection. Spleen: No splenic laceration or hematoma. Adrenals/Urinary Tract: --Adrenal glands: No adrenal hemorrhage. --Right kidney/ureter: No hydronephrosis or perinephric hematoma. --Left kidney/ureter: No hydronephrosis or perinephric hematoma. --Urinary bladder: Unremarkable. Stomach/Bowel: --Stomach/Duodenum: No hiatal hernia or other gastric abnormality. Normal duodenal course and caliber. --Small bowel: No dilatation or inflammation. --Colon: No focal abnormality. --Appendix: Normal. Vascular/Lymphatic: Normal course and caliber of the major abdominal vessels. No abdominal or pelvic lymphadenopathy. Reproductive: Normal prostate and seminal vesicles. Musculoskeletal. No focal osseous lesions of the pelvis or lumbosacral spine.  Other: None. IMPRESSION: No acute abnormality of the chest, abdomen or pelvis. Electronically Signed   By: Ulyses Jarred M.D.   On: 05/13/2018 21:48   Ct Abdomen Pelvis W Contrast  Result Date: 05/13/2018 CLINICAL DATA:  Abdominal pain with nausea and vomiting. Chest pain radiating to left arm. Left lower quadrant pain. EXAM: CT CHEST, ABDOMEN, AND PELVIS WITH CONTRAST TECHNIQUE: Multidetector CT imaging of the chest, abdomen and pelvis was performed following the standard protocol during bolus administration of intravenous contrast. CONTRAST:  164mL ISOVUE-300 IOPAMIDOL (ISOVUE-300) INJECTION 61% COMPARISON:  CT abdomen pelvis 07/22/2013 FINDINGS: CT CHEST FINDINGS Cardiovascular: Heart size is normal without pericardial effusion. The thoracic aorta is normal in course and caliber without dissection, aneurysm, ulceration or intramural hematoma. Mediastinum/Nodes: No mediastinal hematoma. No mediastinal, hilar or axillary lymphadenopathy. The visualized thyroid and thoracic esophageal course are unremarkable. Lungs/Pleura: No pulmonary contusion, pneumothorax or pleural effusion. The central airways are clear. Musculoskeletal: No focal osseous lesion of the ribs, sternum for the visible portions of clavicles and scapulae. CT ABDOMEN PELVIS FINDINGS Hepatobiliary: No hepatic hematoma or laceration. No biliary dilatation. Unchanged 11 mm hypoattenuating focus at the anterior margin of the liver. Status post cholecystectomy. Pancreas: Normal contours without ductal dilatation. No peripancreatic fluid collection. Spleen: No splenic laceration or hematoma. Adrenals/Urinary Tract: --Adrenal  glands: No adrenal hemorrhage. --Right kidney/ureter: No hydronephrosis or perinephric hematoma. --Left kidney/ureter: No hydronephrosis or perinephric hematoma. --Urinary bladder: Unremarkable. Stomach/Bowel: --Stomach/Duodenum: No hiatal hernia or other gastric abnormality. Normal duodenal course and caliber. --Small bowel: No  dilatation or inflammation. --Colon: No focal abnormality. --Appendix: Normal. Vascular/Lymphatic: Normal course and caliber of the major abdominal vessels. No abdominal or pelvic lymphadenopathy. Reproductive: Normal prostate and seminal vesicles. Musculoskeletal. No focal osseous lesions of the pelvis or lumbosacral spine. Other: None. IMPRESSION: No acute abnormality of the chest, abdomen or pelvis. Electronically Signed   By: Ulyses Jarred M.D.   On: 05/13/2018 21:48    Procedures Procedures (including critical care time)  Medications Ordered in ED Medications  lactated ringers bolus 1,000 mL (1,000 mLs Intravenous New Bag/Given 05/13/18 2141)  iopamidol (ISOVUE-300) 61 % injection (has no administration in time range)  potassium chloride SA (K-DUR,KLOR-CON) CR tablet 40 mEq (has no administration in time range)  ibuprofen (ADVIL,MOTRIN) tablet 400 mg (400 mg Oral Given 05/13/18 1652)  ondansetron (ZOFRAN) injection 4 mg (4 mg Intravenous Given 05/13/18 2103)  lactated ringers bolus 1,000 mL (1,000 mLs Intravenous New Bag/Given 05/13/18 2104)  morphine 4 MG/ML injection 8 mg (8 mg Intravenous Given 05/13/18 2138)  iopamidol (ISOVUE-300) 61 % injection 100 mL (100 mLs Intravenous Contrast Given 05/13/18 2125)     Initial Impression / Assessment and Plan / ED Course  I have reviewed the triage vital signs and the nursing notes.  Pertinent labs & imaging results that were available during my care of the patient were reviewed by me and considered in my medical decision making (see chart for details).     The patient is feeling much better after treatment.  Given his mild to moderate hematemesis and associated with chest pain, CT of the chest was obtained as well as the abdomen given the abdominal pain with vomiting.  Unclear why he is having the vomiting given the otherwise negative work-up.  Mild hypokalemia likely from vomiting.  I discussed that could be related to marijuana use and discussed  not using this anymore.  I think the hematemesis is a Mallory-Weiss issue given how hard he was vomiting when I entered the room.  Otherwise, if he has no further vomiting and is able to tolerate p.o. with potassium that I think he stable for discharge home.  Advised him to follow-up with PCP.  Doubt ACS, PE or dissection as far as his chest pain. Care transferred to [facility]  Final Clinical Impressions(s) / ED Diagnoses   Final diagnoses:  None    ED Discharge Orders    None       Sherwood Gambler, MD 05/13/18 2223

## 2018-05-14 MED ORDER — ONDANSETRON 4 MG PO TBDP: 4.0000 mg | ORAL_TABLET | Freq: Three times a day (TID) | ORAL | 0 refills | Status: DC | PRN

## 2018-05-14 MED ORDER — PROMETHAZINE HCL 25 MG RE SUPP: 25.0000 mg | Freq: Four times a day (QID) | RECTAL | 0 refills | Status: DC | PRN

## 2018-05-14 NOTE — Discharge Instructions (Signed)
Take the prescribed medication as directed-- start with zofran, if not helping or cant hold it down use the suppository. Gentle diet for now, lots of fluids. Follow-up with your primary care doctor. Return to the ED for new or worsening symptoms.

## 2018-05-15 ENCOUNTER — Emergency Department (HOSPITAL_COMMUNITY)
Admission: EM | Admit: 2018-05-15 | Discharge: 2018-05-15 | Disposition: A | Discharge status: Home / Self Care | Payer: 59 | Attending: Emergency Medicine | Admitting: Emergency Medicine

## 2018-05-15 ENCOUNTER — Encounter (HOSPITAL_COMMUNITY): Payer: Self-pay

## 2018-05-15 ENCOUNTER — Other Ambulatory Visit: Payer: Self-pay

## 2018-05-15 DIAGNOSIS — H66003 Acute suppurative otitis media without spontaneous rupture of ear drum, bilateral: Secondary | ICD-10-CM

## 2018-05-15 DIAGNOSIS — Z87891 Personal history of nicotine dependence: Secondary | ICD-10-CM | POA: Diagnosis not present

## 2018-05-15 DIAGNOSIS — R112 Nausea with vomiting, unspecified: Secondary | ICD-10-CM | POA: Diagnosis not present

## 2018-05-15 LAB — I-STAT CHEM 8, ED
BUN: 8 mg/dL (ref 6–20)
Calcium, Ion: 1.03 mmol/L — ABNORMAL LOW (ref 1.15–1.40)
Chloride: 103 mmol/L (ref 98–111)
Creatinine, Ser: 0.8 mg/dL (ref 0.61–1.24)
GLUCOSE: 110 mg/dL — AB (ref 70–99)
HEMATOCRIT: 48 % (ref 39.0–52.0)
HEMOGLOBIN: 16.3 g/dL (ref 13.0–17.0)
Potassium: 3.4 mmol/L — ABNORMAL LOW (ref 3.5–5.1)
Sodium: 135 mmol/L (ref 135–145)
TCO2: 21 mmol/L — AB (ref 22–32)

## 2018-05-15 MED ORDER — IBUPROFEN 800 MG PO TABS
800.0000 mg | ORAL_TABLET | Freq: Once | ORAL | Status: AC
  Administered 2018-05-15: 800 mg via ORAL
  Filled 2018-05-15: qty 1

## 2018-05-15 MED ORDER — AMOXICILLIN 500 MG PO CAPS
1000.0000 mg | ORAL_CAPSULE | Freq: Once | ORAL | Status: AC
  Administered 2018-05-15: 1000 mg via ORAL
  Filled 2018-05-15: qty 2

## 2018-05-15 MED ORDER — ACETAMINOPHEN 500 MG PO TABS
1000.0000 mg | ORAL_TABLET | Freq: Once | ORAL | Status: AC
  Administered 2018-05-15: 1000 mg via ORAL
  Filled 2018-05-15: qty 2

## 2018-05-15 MED ORDER — CIPROFLOXACIN-DEXAMETHASONE 0.3-0.1 % OT SUSP: 4.0000 [drp] | Freq: Two times a day (BID) | OTIC | 0 refills | Status: DC

## 2018-05-15 MED ORDER — AMOXICILLIN 500 MG PO CAPS: 1000.0000 mg | ORAL_CAPSULE | Freq: Two times a day (BID) | ORAL | 0 refills | Status: DC

## 2018-05-15 NOTE — ED Triage Notes (Addendum)
Pt from home with c/o N/V x 1 week; discomfort led to CP resulting in coming into ER Tues and pt discharged yesterday. Pt took zofran supp this am but pt stated that he was diaphoretic and bilateral ears "full" and he is not feeling well. Pt denies CP N/V at this time due to taking antiemetic

## 2018-05-15 NOTE — ED Provider Notes (Signed)
[facility] EMERGENCY DEPARTMENT Provider Note   CSN: [id number] Arrival date & time: 05/15/18  0720     History   Chief Complaint Chief Complaint  Patient presents with  . Emesis    w/ bilateral ear discomfort    HPI Steven Black is a 42 y.o. male.  42 yo M with a chief complaint of nausea and vomiting.  The patient was just seen yesterday for similar complaint.  There is been going on for about a week.  He felt that it got worse overnight.  Continued to have nausea and vomiting despite taking his Zofran tablets.  He did Phenergan suppository this morning and was still throwing up so came here.  At this point he feels that the Phenergan has helped him somewhat and is no longer feeling nauseated.  He started having bilateral ear pain as well.  Denies recent water immersion.  Has been coughing a little bit as well.  No known sick contacts.  He was in [place] recently and thinks that he might of been bit by a tick but is unsure.  He has a small lesion to his right side with surrounding erythema.  The history is provided by the patient.  Emesis   This is a new problem. The current episode started yesterday. The problem occurs 5 to 10 times per day. The problem has not changed since onset.There has been no fever. The fever has been present for less than 1 day. Pertinent negatives include no abdominal pain, no arthralgias, no chills, no diarrhea, no fever, no headaches and no myalgias.    Past Medical History:  Diagnosis Date  . Emesis     There are no active problems to display for this patient.   Past Surgical History:  Procedure Laterality Date  . CHOLECYSTECTOMY          Home Medications    Prior to Admission medications   Medication Sig Start Date End Date Taking? Authorizing Provider  ondansetron (ZOFRAN ODT) 4 MG disintegrating tablet Take 1 tablet (4 mg total) by mouth every 8 (eight) hours as needed for nausea. 05/14/18  Yes Larene Pickett, PA-C    promethazine (PHENERGAN) 25 MG suppository Place 1 suppository (25 mg total) rectally every 6 (six) hours as needed for nausea or vomiting. 05/14/18  Yes Larene Pickett, PA-C  amoxicillin (AMOXIL) 500 MG capsule Take 2 capsules (1,000 mg total) by mouth 2 (two) times daily. 05/15/18   Deno Etienne, DO  ciprofloxacin-dexamethasone (CIPRODEX) OTIC suspension Place 4 drops into both ears 2 (two) times daily. 05/15/18   Deno Etienne, DO  ondansetron (ZOFRAN) 4 MG tablet Take 1 tablet (4 mg total) by mouth every 6 (six) hours. Patient not taking: Reported on 11/27/2017 05/19/15   Barrett, Lahoma Crocker, PA-C    Family History History reviewed. No pertinent family history.  Social History Social History   Tobacco Use  . Smoking status: Former Research scientist (life sciences)  . Smokeless tobacco: Never Used  . Tobacco comment: vape with nicotine  Substance Use Topics  . Alcohol use: No  . Drug use: Yes    Types: Marijuana     Allergies   Patient has no known allergies.   Review of Systems Review of Systems  Constitutional: Negative for chills and fever.  HENT: Negative for congestion and facial swelling.   Eyes: Negative for discharge and visual disturbance.  Respiratory: Negative for shortness of breath.   Cardiovascular: Negative for chest pain and palpitations.  Gastrointestinal: Negative for  abdominal pain, diarrhea and vomiting.  Musculoskeletal: Negative for arthralgias and myalgias.  Skin: Negative for color change and rash.  Neurological: Negative for tremors, syncope and headaches.  Psychiatric/Behavioral: Negative for confusion and dysphoric mood.     Physical Exam Updated Vital Signs BP 129/86   Pulse 60   Resp 20   SpO2 95%   Physical Exam  Constitutional: He is oriented to person, place, and time. He appears well-developed and well-nourished.  HENT:  Head: Normocephalic and atraumatic.  Swollen turbinates, posterior nasal drip, no noted sinus ttp, TM with bilateral bulging, also edema to the  canals.     Eyes: Pupils are equal, round, and reactive to light. EOM are normal.  Neck: Normal range of motion. Neck supple. No JVD present.  Cardiovascular: Normal rate and regular rhythm. Exam reveals no gallop and no friction rub.  No murmur heard. Pulmonary/Chest: No respiratory distress. He has no wheezes.  Abdominal: He exhibits no distension and no mass. There is no tenderness. There is no rebound and no guarding.  Musculoskeletal: Normal range of motion.  Neurological: He is alert and oriented to person, place, and time.  Skin: No rash noted. No pallor.  Punctate lesion to right side along the oblique, pustule with surrounding erythema.  No induration no fluctuance.  Psychiatric: He has a normal mood and affect. His behavior is normal.  Nursing note and vitals reviewed.    ED Treatments / Results  Labs (all labs ordered are listed, but only abnormal results are displayed) Labs Reviewed  I-STAT CHEM 8, ED - Abnormal; Notable for the following components:      Result Value   Potassium 3.4 (*)    Glucose, Bld 110 (*)    Calcium, Ion 1.03 (*)    TCO2 21 (*)    All other components within normal limits    EKG EKG Interpretation  Date/Time:  Thursday May 15 2018 07:46:53 EDT Ventricular Rate:  66 PR Interval:    QRS Duration: 96 QT Interval:  407 QTC Calculation: 427 R Axis:   75 Text Interpretation:  Sinus rhythm Probable left atrial enlargement No significant change since last tracing Confirmed by Deno Etienne [phone]) on 05/15/2018 7:55:07 AM   Radiology Dg Chest 2 View  Result Date: 05/13/2018 CLINICAL DATA:  Nausea and vomiting with chest pain EXAM: CHEST - 2 VIEW COMPARISON:  11/27/2017 FINDINGS: There is increased interstitial opacity. Cardiomediastinal contours are normal. No focal consolidation. No pleural effusion or pneumothorax. IMPRESSION: Mild interstitial opacities possibly indicating mild pulmonary edema. No focal consolidation. Electronically Signed    By: Ulyses Jarred M.D.   On: 05/13/2018 17:22   Ct Chest W Contrast  Result Date: 05/13/2018 CLINICAL DATA:  Abdominal pain with nausea and vomiting. Chest pain radiating to left arm. Left lower quadrant pain. EXAM: CT CHEST, ABDOMEN, AND PELVIS WITH CONTRAST TECHNIQUE: Multidetector CT imaging of the chest, abdomen and pelvis was performed following the standard protocol during bolus administration of intravenous contrast. CONTRAST:  135mL ISOVUE-300 IOPAMIDOL (ISOVUE-300) INJECTION 61% COMPARISON:  CT abdomen pelvis 07/22/2013 FINDINGS: CT CHEST FINDINGS Cardiovascular: Heart size is normal without pericardial effusion. The thoracic aorta is normal in course and caliber without dissection, aneurysm, ulceration or intramural hematoma. Mediastinum/Nodes: No mediastinal hematoma. No mediastinal, hilar or axillary lymphadenopathy. The visualized thyroid and thoracic esophageal course are unremarkable. Lungs/Pleura: No pulmonary contusion, pneumothorax or pleural effusion. The central airways are clear. Musculoskeletal: No focal osseous lesion of the ribs, sternum for the visible portions of clavicles  and scapulae. CT ABDOMEN PELVIS FINDINGS Hepatobiliary: No hepatic hematoma or laceration. No biliary dilatation. Unchanged 11 mm hypoattenuating focus at the anterior margin of the liver. Status post cholecystectomy. Pancreas: Normal contours without ductal dilatation. No peripancreatic fluid collection. Spleen: No splenic laceration or hematoma. Adrenals/Urinary Tract: --Adrenal glands: No adrenal hemorrhage. --Right kidney/ureter: No hydronephrosis or perinephric hematoma. --Left kidney/ureter: No hydronephrosis or perinephric hematoma. --Urinary bladder: Unremarkable. Stomach/Bowel: --Stomach/Duodenum: No hiatal hernia or other gastric abnormality. Normal duodenal course and caliber. --Small bowel: No dilatation or inflammation. --Colon: No focal abnormality. --Appendix: Normal. Vascular/Lymphatic: Normal course  and caliber of the major abdominal vessels. No abdominal or pelvic lymphadenopathy. Reproductive: Normal prostate and seminal vesicles. Musculoskeletal. No focal osseous lesions of the pelvis or lumbosacral spine. Other: None. IMPRESSION: No acute abnormality of the chest, abdomen or pelvis. Electronically Signed   By: Ulyses Jarred M.D.   On: 05/13/2018 21:48   Ct Abdomen Pelvis W Contrast  Result Date: 05/13/2018 CLINICAL DATA:  Abdominal pain with nausea and vomiting. Chest pain radiating to left arm. Left lower quadrant pain. EXAM: CT CHEST, ABDOMEN, AND PELVIS WITH CONTRAST TECHNIQUE: Multidetector CT imaging of the chest, abdomen and pelvis was performed following the standard protocol during bolus administration of intravenous contrast. CONTRAST:  146mL ISOVUE-300 IOPAMIDOL (ISOVUE-300) INJECTION 61% COMPARISON:  CT abdomen pelvis 07/22/2013 FINDINGS: CT CHEST FINDINGS Cardiovascular: Heart size is normal without pericardial effusion. The thoracic aorta is normal in course and caliber without dissection, aneurysm, ulceration or intramural hematoma. Mediastinum/Nodes: No mediastinal hematoma. No mediastinal, hilar or axillary lymphadenopathy. The visualized thyroid and thoracic esophageal course are unremarkable. Lungs/Pleura: No pulmonary contusion, pneumothorax or pleural effusion. The central airways are clear. Musculoskeletal: No focal osseous lesion of the ribs, sternum for the visible portions of clavicles and scapulae. CT ABDOMEN PELVIS FINDINGS Hepatobiliary: No hepatic hematoma or laceration. No biliary dilatation. Unchanged 11 mm hypoattenuating focus at the anterior margin of the liver. Status post cholecystectomy. Pancreas: Normal contours without ductal dilatation. No peripancreatic fluid collection. Spleen: No splenic laceration or hematoma. Adrenals/Urinary Tract: --Adrenal glands: No adrenal hemorrhage. --Right kidney/ureter: No hydronephrosis or perinephric hematoma. --Left kidney/ureter:  No hydronephrosis or perinephric hematoma. --Urinary bladder: Unremarkable. Stomach/Bowel: --Stomach/Duodenum: No hiatal hernia or other gastric abnormality. Normal duodenal course and caliber. --Small bowel: No dilatation or inflammation. --Colon: No focal abnormality. --Appendix: Normal. Vascular/Lymphatic: Normal course and caliber of the major abdominal vessels. No abdominal or pelvic lymphadenopathy. Reproductive: Normal prostate and seminal vesicles. Musculoskeletal. No focal osseous lesions of the pelvis or lumbosacral spine. Other: None. IMPRESSION: No acute abnormality of the chest, abdomen or pelvis. Electronically Signed   By: Ulyses Jarred M.D.   On: 05/13/2018 21:48    Procedures Procedures (including critical care time)  Medications Ordered in ED Medications  acetaminophen (TYLENOL) tablet 1,000 mg (has no administration in time range)  ibuprofen (ADVIL,MOTRIN) tablet 800 mg (has no administration in time range)  amoxicillin (AMOXIL) capsule 1,000 mg (1,000 mg Oral Given 05/15/18 0806)     Initial Impression / Assessment and Plan / ED Course  I have reviewed the triage vital signs and the nursing notes.  Pertinent labs & imaging results that were available during my care of the patient were reviewed by me and considered in my medical decision making (see chart for details).     42 yo M with a chief complaint of URI-like symptoms as well as nausea vomiting and diarrhea.  He was seen yesterday and had a work-up with a CT scan of  the chest as well as abdomen pelvis.  He had a CBC CMP lipase and a delta troponin.  At this point with him vomiting overnight I will do a i-STAT Chem-8 to evaluate for electrolyte abnormality.  I do not feel that imaging needs to be repeated today.  He has very minimal abdominal tenderness if any on my exam.  He does have bilateral otitis media with possible otitis externa.  We will give an oral trial and a dose of amoxicillin.  Tolerating p.o. without  difficulty.  Renal function is slightly better than it was yesterday.  Potassium is very mildly low.  Hemoglobin at baseline.  Discharge home.  9:02 AM:  I have discussed the diagnosis/risks/treatment options with the patient and family and believe the pt to be eligible for discharge home to follow-up with PCP. We also discussed returning to the ED immediately if new or worsening sx occur. We discussed the sx which are most concerning (e.g., sudden worsening pain, fever, inability to tolerate by mouth) that necessitate immediate return. Medications administered to the patient during their visit and any new prescriptions provided to the patient are listed below.  Medications given during this visit Medications  acetaminophen (TYLENOL) tablet 1,000 mg (has no administration in time range)  ibuprofen (ADVIL,MOTRIN) tablet 800 mg (has no administration in time range)  amoxicillin (AMOXIL) capsule 1,000 mg (1,000 mg Oral Given 05/15/18 0806)     The patient appears reasonably screen and/or stabilized for discharge and I doubt any other medical condition or other [facility] requiring further screening, evaluation, or treatment in the ED at this time prior to discharge.    Final Clinical Impressions(s) / ED Diagnoses   Final diagnoses:  Acute suppurative otitis media of both ears without spontaneous rupture of tympanic membranes, recurrence not specified    ED Discharge Orders         Ordered    amoxicillin (AMOXIL) 500 MG capsule  2 times daily     05/15/18 0901    ciprofloxacin-dexamethasone (CIPRODEX) OTIC suspension  2 times daily     05/15/18 0901           Deno Etienne, DO 05/15/18 0902

## 2018-05-15 NOTE — Discharge Instructions (Signed)
Follow up with your PCP.  Return for worsening symptoms.  Take you nausea meds as prescribed.

## 2018-05-15 NOTE — ED Notes (Signed)
Pt discharged from ED; instructions provided and scripts given; Pt encouraged to return to ED if symptoms worsen and to f/u with PCP; Pt verbalized understanding of all instructions 

## 2018-07-17 ENCOUNTER — Encounter (HOSPITAL_COMMUNITY): Payer: Self-pay | Admitting: Emergency Medicine

## 2018-07-17 ENCOUNTER — Emergency Department (HOSPITAL_COMMUNITY)
Admission: EM | Admit: 2018-07-17 | Discharge: 2018-07-17 | Disposition: A | Discharge status: Home / Self Care | Payer: 59 | Attending: Emergency Medicine | Admitting: Emergency Medicine

## 2018-07-17 ENCOUNTER — Other Ambulatory Visit: Payer: Self-pay

## 2018-07-17 DIAGNOSIS — R1013 Epigastric pain: Secondary | ICD-10-CM | POA: Diagnosis not present

## 2018-07-17 DIAGNOSIS — I499 Cardiac arrhythmia, unspecified: Secondary | ICD-10-CM | POA: Diagnosis not present

## 2018-07-17 DIAGNOSIS — R112 Nausea with vomiting, unspecified: Secondary | ICD-10-CM | POA: Diagnosis not present

## 2018-07-17 LAB — URINALYSIS, ROUTINE W REFLEX MICROSCOPIC
BILIRUBIN URINE: NEGATIVE
Glucose, UA: NEGATIVE mg/dL
KETONES UR: NEGATIVE mg/dL
Leukocytes, UA: NEGATIVE
NITRITE: NEGATIVE
Protein, ur: NEGATIVE mg/dL
SPECIFIC GRAVITY, URINE: 1.029 (ref 1.005–1.030)
pH: 5 (ref 5.0–8.0)

## 2018-07-17 LAB — CBC
HEMATOCRIT: 55.3 % — AB (ref 39.0–52.0)
HEMOGLOBIN: 18.7 g/dL — AB (ref 13.0–17.0)
MCH: 30.5 pg (ref 26.0–34.0)
MCHC: 33.8 g/dL (ref 30.0–36.0)
MCV: 90.1 fL (ref 80.0–100.0)
Platelets: 200 10*3/uL (ref 150–400)
RBC: 6.14 MIL/uL — AB (ref 4.22–5.81)
RDW: 13.7 % (ref 11.5–15.5)
WBC: 16.9 10*3/uL — AB (ref 4.0–10.5)
nRBC: 0 % (ref 0.0–0.2)

## 2018-07-17 LAB — COMPREHENSIVE METABOLIC PANEL
ALT: 49 U/L — ABNORMAL HIGH (ref 0–44)
AST: 46 U/L — ABNORMAL HIGH (ref 15–41)
Albumin: 4.6 g/dL (ref 3.5–5.0)
Alkaline Phosphatase: 51 U/L (ref 38–126)
Anion gap: 11 (ref 5–15)
BILIRUBIN TOTAL: 1.8 mg/dL — AB (ref 0.3–1.2)
BUN: 13 mg/dL (ref 6–20)
CHLORIDE: 103 mmol/L (ref 98–111)
CO2: 22 mmol/L (ref 22–32)
Calcium: 9.5 mg/dL (ref 8.9–10.3)
Creatinine, Ser: 1.19 mg/dL (ref 0.61–1.24)
GFR calc Af Amer: >60 mL/min (ref >60)
Glucose, Bld: 150 mg/dL — ABNORMAL HIGH (ref 70–99)
POTASSIUM: 3.2 mmol/L — AB (ref 3.5–5.1)
Sodium: 136 mmol/L (ref 135–145)
TOTAL PROTEIN: 7.9 g/dL (ref 6.5–8.1)

## 2018-07-17 LAB — LIPASE, BLOOD: LIPASE: 29 U/L (ref 11–51)

## 2018-07-17 MED ORDER — FAMOTIDINE 20 MG PO TABS: 20.0000 mg | ORAL_TABLET | Freq: Two times a day (BID) | ORAL | 0 refills | Status: DC

## 2018-07-17 MED ORDER — LORAZEPAM 2 MG/ML IJ SOLN
1.0000 mg | Freq: Once | INTRAMUSCULAR | Status: AC
  Administered 2018-07-17: 1 mg via INTRAVENOUS
  Filled 2018-07-17: qty 1

## 2018-07-17 MED ORDER — SODIUM CHLORIDE 0.9 % IV BOLUS
1000.0000 mL | Freq: Once | INTRAVENOUS | Status: AC
  Administered 2018-07-17: 1000 mL via INTRAVENOUS

## 2018-07-17 MED ORDER — CAPSAICIN 0.025 % EX CREA
TOPICAL_CREAM | Freq: Once | CUTANEOUS | Status: AC
  Administered 2018-07-17: 08:00:00 via TOPICAL
  Filled 2018-07-17: qty 60

## 2018-07-17 MED ORDER — FAMOTIDINE IN NACL 20-0.9 MG/50ML-% IV SOLN
20.0000 mg | Freq: Once | INTRAVENOUS | Status: AC
  Administered 2018-07-17: 20 mg via INTRAVENOUS
  Filled 2018-07-17: qty 50

## 2018-07-17 NOTE — ED Notes (Signed)
Patient given fluid-he vomited Did not tolerate well

## 2018-07-17 NOTE — ED Provider Notes (Signed)
[facility] EMERGENCY DEPARTMENT Provider Note   CSN: [id number] Arrival date & time: 07/17/18  0547     History   Chief Complaint Chief Complaint  Patient presents with  . Emesis    HPI Steven Black is a 42 y.o. male with no pertinent past medical history who presents to the emergency department with a chief complaint of emesis.  The patient endorses greater than 12 episodes of nonbloody, nonbilious emesis, onset 4-5 days ago.  He reports associated nausea,epigastric pain, subjective fever, and tingling in his bilateral upper extremities. He has been unable to keep down any food or fluids since onset.   Epigastric pain is described as cramping in nature.  It comes and goes and will last approximately 30 minutes before resolving.  No known aggravating or alleviating factors.  Reports he smoked marijuana to try and improve the nausea. Denies THC use prior to onset of symptoms. He has also been taking Zofran with no improvement. He has also been soaking in hot baths with some improvement in symptoms.   He denies chest pain, dyspnea, chills, constipation, melena, hematochezia, diarrhea, constipation, dysuria, hematuria, penile or testicular pain or swelling.   No known sick contacts. States "I think I may be allergic to marinara sauce, and I think that's what caused this."   Reports he is a current, every day 0.5 ppd smoker. Denies ETOH. No other recreational or IV drug use other than what is stated above. Surgical hx includes cholecystectomy.   The history is provided by the patient. No language interpreter was used.    Past Medical History:  Diagnosis Date  . Emesis     There are no active problems to display for this patient.   Past Surgical History:  Procedure Laterality Date  . CHOLECYSTECTOMY          Home Medications    Prior to Admission medications   Medication Sig Start Date End Date Taking? Authorizing Provider  famotidine (PEPCID) 20  MG tablet Take 1 tablet (20 mg total) by mouth 2 (two) times daily. 07/17/18   Jenniferann Stuckert A, PA-C    Family History No family history on file.  Social History Social History   Tobacco Use  . Smoking status: Former Research scientist (life sciences)  . Smokeless tobacco: Never Used  . Tobacco comment: vape with nicotine  Substance Use Topics  . Alcohol use: No  . Drug use: Yes    Types: Marijuana     Allergies   Patient has no known allergies.   Review of Systems Review of Systems  Constitutional: Positive for fever. Negative for appetite change and chills.  Respiratory: Negative for shortness of breath.   Cardiovascular: Negative for chest pain.  Gastrointestinal: Positive for abdominal pain, nausea and vomiting. Negative for anal bleeding, blood in stool, constipation, diarrhea and rectal pain.  Genitourinary: Negative for dysuria, frequency, hematuria, penile swelling, scrotal swelling and testicular pain.  Musculoskeletal: Negative for back pain.  Skin: Negative for rash.  Allergic/Immunologic: Negative for immunocompromised state.  Neurological: Negative for weakness, light-headedness and headaches.       Paresthesias  Psychiatric/Behavioral: Negative for confusion.     Physical Exam Updated Vital Signs BP (!) 142/99   Pulse (!) 58   Temp 99.1 F (37.3 C) (Oral)   Resp (!) 22   SpO2 95%   Physical Exam  Constitutional: He appears well-developed.  Ill appearing  HENT:  Head: Normocephalic.  Eyes: Conjunctivae are normal.  Neck: Neck supple.  Cardiovascular: Normal  rate and regular rhythm. Exam reveals no gallop and no friction rub.  No murmur heard. Pulmonary/Chest: Effort normal. No stridor. No respiratory distress. He has no wheezes. He has no rales. He exhibits no tenderness.  Abdominal: Soft. He exhibits no distension and no mass. There is tenderness. There is no rebound and no guarding. No hernia.  Tender to palpation in the epigastric region without rebound or guarding.   No CVA tenderness bilaterally.  No organomegaly.  No peritoneal signs.  Abdomen is soft, nondistended.  Neurological: He is alert.  Skin: Skin is warm and dry.  Psychiatric: His behavior is normal.  Nursing note and vitals reviewed.    ED Treatments / Results  Labs (all labs ordered are listed, but only abnormal results are displayed) Labs Reviewed  COMPREHENSIVE METABOLIC PANEL - Abnormal; Notable for the following components:      Result Value   Potassium 3.2 (*)    Glucose, Bld 150 (*)    AST 46 (*)    ALT 49 (*)    Total Bilirubin 1.8 (*)    All other components within normal limits  CBC - Abnormal; Notable for the following components:   WBC 16.9 (*)    RBC 6.14 (*)    Hemoglobin 18.7 (*)    HCT 55.3 (*)    All other components within normal limits  URINALYSIS, ROUTINE W REFLEX MICROSCOPIC - Abnormal; Notable for the following components:   Color, Urine AMBER (*)    Hgb urine dipstick SMALL (*)    Bacteria, UA FEW (*)    All other components within normal limits  LIPASE, BLOOD    EKG EKG Interpretation  Date/Time:  Thursday July 17 2018 06:52:45 EDT Ventricular Rate:  64 PR Interval:    QRS Duration: 102 QT Interval:  450 QTC Calculation: 465 R Axis:   80 Text Interpretation:  Sinus arrhythmia LAE, consider biatrial enlargement Confirmed by Veryl Speak [phone]) on 07/17/2018 6:56:17 AM Also confirmed by Veryl Speak [phone]), editor Hattie Perch ([id number])  on 07/17/2018 7:12:46 AM   Radiology No results found.  Procedures Procedures (including critical care time)  Medications Ordered in ED Medications  sodium chloride 0.9 % bolus 1,000 mL (0 mLs Intravenous Stopped 07/17/18 0931)  LORazepam (ATIVAN) injection 1 mg (1 mg Intravenous Given 07/17/18 0737)  capsaicin (ZOSTRIX) 0.025 % cream ( Topical Given 07/17/18 0735)  sodium chloride 0.9 % bolus 1,000 mL (0 mLs Intravenous Stopped 07/17/18 0931)  famotidine (PEPCID) IVPB 20 mg premix (0 mg  Intravenous Stopped 07/17/18 1112)     Initial Impression / Assessment and Plan / ED Course  I have reviewed the triage vital signs and the nursing notes.  Pertinent labs & imaging results that were available during my care of the patient were reviewed by me and considered in my medical decision making (see chart for details).     42 year old male with no pertinent past medical history presenting with nausea, vomiting, and epigastric pain.  He thinks that it may be related to some bad marinara sauce.  Per chart review, the patient has been seen multiple times over the past few years for concerns for different foods that may be causing similar symptoms.  He also endorses smoking marijuana to help with his nausea.  On exam, he is ill-appearing and appears dehydrated.  He does not have a surgical abdomen.  CBC with leukocytosis and elevated hemoglobin and hematocrit, concerning for hemoconcentration secondary to dehydration.  Minimal elevation in transaminases; however, on chart  review they have been elevated before and return to baseline.  The patient was treated with IV fluids and Ativan for nausea given EKG with QTC of 465, uptrending from previous.   He was given capsaicin cream for pain. On reevaluation, the patient reports that he was feeling much better but was complaining of redness to his abdomen from the capsaicin cream, which was removed and equal cloth was placed on the abdomen.  His pain had resolved.  He was successfully fluid challenged.  He was asymptomatic with no abdominal pain, nausea, or paresthesias.  Doubt pancreatitis, bowel obstruction, diverticulitis, cholecystitis, appendicitis, or testicular torsion.  Suspect hyperemesis cannabinoid syndrome versus gastritis.  Reports that he has antiemetics at home and does not need prescription.  Strict return precautions given.  He is hemodynamically stable and in no acute distress.  He is safe for discharge home with outpatient follow-up at  this time.  Final Clinical Impressions(s) / ED Diagnoses   Final diagnoses:  Epigastric pain  Non-intractable vomiting with nausea, unspecified vomiting type    ED Discharge Orders         Ordered    famotidine (PEPCID) 20 MG tablet  2 times daily     07/17/18 1114           Trellis Guirguis A, PA-C 07/17/18 1644    Veryl Speak, MD 07/17/18 2326

## 2018-07-17 NOTE — ED Triage Notes (Signed)
Pt states he began having nausea and vomiting Sunday night into Monday. Had some zofran at home and tried to treat it with that. Pt states he has kept nothing po down since Sunday night. Feels dehydrated and extremities are "tingling" Pt endorses left sided abdominal pain. Ill appearing in triage.

## 2018-07-17 NOTE — ED Notes (Signed)
D/c reviewed with PT 

## 2018-07-17 NOTE — Discharge Instructions (Signed)
Thank you for allowing me to care for you today in the Emergency Department.   Avoid marijuana. Keep your follow up appointment with gastroenterology.  Use your home Zofran as prescribed.  You can take 600 mg of ibuprofen with food or 650 mg of Tylenol for pain control.  Return to the emergency department if you develop significantly worsening symptoms including chest pain, persistent vomiting despite taking Zofran, high fever, severe abdominal pain, or other new, concerning symptoms.

## 2018-07-17 NOTE — ED Notes (Signed)
Patient is fully dressed, standing outside the room stating that he was told that he could go home. Patient is encouraged to wait in the room for d/c document

## 2018-07-18 ENCOUNTER — Emergency Department (HOSPITAL_COMMUNITY)
Admission: EM | Admit: 2018-07-18 | Discharge: 2018-07-18 | Disposition: A | Discharge status: Home / Self Care | Payer: 59 | Attending: Emergency Medicine | Admitting: Emergency Medicine

## 2018-07-18 ENCOUNTER — Encounter (HOSPITAL_COMMUNITY): Payer: Self-pay | Admitting: Emergency Medicine

## 2018-07-18 ENCOUNTER — Emergency Department (HOSPITAL_COMMUNITY): Payer: 59

## 2018-07-18 DIAGNOSIS — R109 Unspecified abdominal pain: Secondary | ICD-10-CM | POA: Diagnosis not present

## 2018-07-18 DIAGNOSIS — Z87891 Personal history of nicotine dependence: Secondary | ICD-10-CM | POA: Insufficient documentation

## 2018-07-18 DIAGNOSIS — R110 Nausea: Secondary | ICD-10-CM

## 2018-07-18 DIAGNOSIS — G930 Cerebral cysts: Secondary | ICD-10-CM | POA: Diagnosis not present

## 2018-07-18 DIAGNOSIS — R112 Nausea with vomiting, unspecified: Secondary | ICD-10-CM | POA: Diagnosis not present

## 2018-07-18 DIAGNOSIS — Z79899 Other long term (current) drug therapy: Secondary | ICD-10-CM | POA: Insufficient documentation

## 2018-07-18 DIAGNOSIS — E876 Hypokalemia: Secondary | ICD-10-CM | POA: Diagnosis not present

## 2018-07-18 DIAGNOSIS — R51 Headache: Secondary | ICD-10-CM | POA: Diagnosis not present

## 2018-07-18 LAB — CBC WITH DIFFERENTIAL/PLATELET
ABS IMMATURE GRANULOCYTES: 0.05 10*3/uL (ref 0.00–0.07)
Basophils Absolute: 0.1 10*3/uL (ref 0.0–0.1)
Basophils Relative: 1 %
Eosinophils Absolute: 0.2 10*3/uL (ref 0.0–0.5)
Eosinophils Relative: 2 %
HCT: 51 % (ref 39.0–52.0)
HEMOGLOBIN: 16.3 g/dL (ref 13.0–17.0)
IMMATURE GRANULOCYTES: 1 %
LYMPHS PCT: 24 %
Lymphs Abs: 2.6 10*3/uL (ref 0.7–4.0)
MCH: 29.7 pg (ref 26.0–34.0)
MCHC: 32 g/dL (ref 30.0–36.0)
MCV: 92.9 fL (ref 80.0–100.0)
MONO ABS: 1.1 10*3/uL — AB (ref 0.1–1.0)
MONOS PCT: 10 %
NEUTROS ABS: 7 10*3/uL (ref 1.7–7.7)
Neutrophils Relative %: 62 %
PLATELETS: 159 10*3/uL (ref 150–400)
RBC: 5.49 MIL/uL (ref 4.22–5.81)
RDW: 13.2 % (ref 11.5–15.5)
WBC: 11 10*3/uL — AB (ref 4.0–10.5)
nRBC: 0 % (ref 0.0–0.2)

## 2018-07-18 LAB — COMPREHENSIVE METABOLIC PANEL
ALK PHOS: 41 U/L (ref 38–126)
ALT: 36 U/L (ref 0–44)
AST: 26 U/L (ref 15–41)
Albumin: 3.8 g/dL (ref 3.5–5.0)
Anion gap: 8 (ref 5–15)
BUN: 10 mg/dL (ref 6–20)
CALCIUM: 8.5 mg/dL — AB (ref 8.9–10.3)
CO2: 23 mmol/L (ref 22–32)
CREATININE: 0.88 mg/dL (ref 0.61–1.24)
Chloride: 105 mmol/L (ref 98–111)
Glucose, Bld: 106 mg/dL — ABNORMAL HIGH (ref 70–99)
Potassium: 3 mmol/L — ABNORMAL LOW (ref 3.5–5.1)
SODIUM: 136 mmol/L (ref 135–145)
Total Bilirubin: 1.3 mg/dL — ABNORMAL HIGH (ref 0.3–1.2)
Total Protein: 6.1 g/dL — ABNORMAL LOW (ref 6.5–8.1)

## 2018-07-18 LAB — LIPASE, BLOOD: Lipase: 48 U/L (ref 11–51)

## 2018-07-18 LAB — RAPID URINE DRUG SCREEN, HOSP PERFORMED
Amphetamines: NOT DETECTED
Barbiturates: NOT DETECTED
Benzodiazepines: NOT DETECTED
Cocaine: NOT DETECTED
OPIATES: NOT DETECTED
Tetrahydrocannabinol: POSITIVE — AB

## 2018-07-18 LAB — MAGNESIUM: MAGNESIUM: 2.3 mg/dL (ref 1.7–2.4)

## 2018-07-18 MED ORDER — METOCLOPRAMIDE HCL 5 MG/ML IJ SOLN
10.0000 mg | Freq: Once | INTRAMUSCULAR | Status: AC
  Administered 2018-07-18: 10 mg via INTRAVENOUS
  Filled 2018-07-18: qty 2

## 2018-07-18 MED ORDER — PROMETHAZINE HCL 25 MG PO TABS: 25.0000 mg | ORAL_TABLET | Freq: Four times a day (QID) | ORAL | 0 refills | Status: DC | PRN

## 2018-07-18 MED ORDER — LACTATED RINGERS IV BOLUS
1000.0000 mL | Freq: Once | INTRAVENOUS | Status: AC
  Administered 2018-07-18: 1000 mL via INTRAVENOUS

## 2018-07-18 MED ORDER — POTASSIUM CHLORIDE CRYS ER 20 MEQ PO TBCR
40.0000 meq | EXTENDED_RELEASE_TABLET | Freq: Once | ORAL | Status: AC
  Administered 2018-07-18: 40 meq via ORAL
  Filled 2018-07-18: qty 2

## 2018-07-18 NOTE — ED Provider Notes (Signed)
[place] EMERGENCY DEPARTMENT Provider Note   CSN: [id number] Arrival date & time: 07/18/18  0076     History   Chief Complaint Chief Complaint  Patient presents with  . Nausea    HPI Steven Black is a 42 y.o. male.  The history is provided by the patient. No language interpreter was used.   Steven Black is a 42 y.o. male who presents to the Emergency Department complaining of nausea, dehydration. He presents to the emergency department complaining of nausea and vomiting since Monday. He reports nausea with 6 to 12 episodes of yellow emesis daily. Four days ago he developed a mild frontal headache. Over the last few days he reports feeling dehydrated, and ability to take oral fluids because it feels like there is light in his stomach as well as difficulty with walking. He feels very unsteady and off balance when going to walk. He denies any diarrhea. His last bowel movement was several days ago. No dysuria. No numbness. He has been taken Zofran which decreases his vomiting but does not resolve it. No fevers. He is scheduled to follow-up with G.I. on Tuesday. He has a history of cholecystectomy. He reports generalized abdominal pain since yesterday. Past Medical History:  Diagnosis Date  . Emesis     There are no active problems to display for this patient.   Past Surgical History:  Procedure Laterality Date  . CHOLECYSTECTOMY          Home Medications    Prior to Admission medications   Medication Sig Start Date End Date Taking? Authorizing Provider  famotidine (PEPCID) 20 MG tablet Take 1 tablet (20 mg total) by mouth 2 (two) times daily. 07/17/18   McDonald, Mia A, PA-C  promethazine (PHENERGAN) 25 MG tablet Take 1 tablet (25 mg total) by mouth every 6 (six) hours as needed for nausea or vomiting. 07/18/18   Quintella Reichert, MD    Family History No family history on file.  Social History Social History   Tobacco Use  . Smoking status: Former  Research scientist (life sciences)  . Smokeless tobacco: Never Used  . Tobacco comment: vape with nicotine  Substance Use Topics  . Alcohol use: No  . Drug use: Yes    Types: Marijuana     Allergies   Patient has no known allergies.   Review of Systems Review of Systems  All other systems reviewed and are negative.    Physical Exam Updated Vital Signs BP (!) 129/99   Pulse 68   Temp 98 F (36.7 C) (Oral)   SpO2 100%   Physical Exam  Constitutional: He is oriented to person, place, and time. He appears well-developed and well-nourished.  HENT:  Head: Normocephalic and atraumatic.  Dry mucous membranes  Cardiovascular: Normal rate and regular rhythm.  No murmur heard. Pulmonary/Chest: Effort normal and breath sounds normal. No respiratory distress.  Abdominal: Soft. There is no rebound and no guarding.  Mild generalized abdominal tenderness  Musculoskeletal: He exhibits no edema or tenderness.  Neurological: He is alert and oriented to person, place, and time.  Pupils equal round and reactive. No facial asymmetry. Mild generalized weakness. Visual fields are grossly intact. No pronator drift.  Skin: Skin is warm and dry.  Psychiatric: He has a normal mood and affect. His behavior is normal.  Nursing note and vitals reviewed.    ED Treatments / Results  Labs (all labs ordered are listed, but only abnormal results are displayed) Labs Reviewed  CBC WITH DIFFERENTIAL/PLATELET -  Abnormal; Notable for the following components:      Result Value   WBC 11.0 (*)    Monocytes Absolute 1.1 (*)    All other components within normal limits  COMPREHENSIVE METABOLIC PANEL - Abnormal; Notable for the following components:   Potassium 3.0 (*)    Glucose, Bld 106 (*)    Calcium 8.5 (*)    Total Protein 6.1 (*)    Total Bilirubin 1.3 (*)    All other components within normal limits  RAPID URINE DRUG SCREEN, HOSP PERFORMED - Abnormal; Notable for the following components:   Tetrahydrocannabinol  POSITIVE (*)    All other components within normal limits  LIPASE, BLOOD  MAGNESIUM    EKG None  Radiology Dg Abdomen 1 View  Result Date: 07/18/2018 CLINICAL DATA:  Nausea, vomiting, abdominal pain EXAM: ABDOMEN - 1 VIEW COMPARISON:  11/27/2017 FINDINGS: Prior cholecystectomy. Calcified phleboliths in the lower pelvis, stable. There is a non obstructive bowel gas pattern. No supine evidence of free air. No organomegaly or suspicious calcification. No acute bony abnormality. IMPRESSION: No acute findings. Electronically Signed   By: Rolm Baptise M.D.   On: 07/18/2018 10:16   Ct Head Wo Contrast  Result Date: 07/18/2018 CLINICAL DATA:  Headache with nausea and vomiting EXAM: CT HEAD WITHOUT CONTRAST TECHNIQUE: Contiguous axial images were obtained from the base of the skull through the vertex without intravenous contrast. COMPARISON:  None. FINDINGS: Brain: The ventricles are normal in size and configuration. 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 There is no other evident mass. There is no intra-axial mass or hemorrhage. There is no subdural or epidural fluid collection. No midline shift. Brain parenchyma appears unremarkable. No evident acute infarct. Right temporal horn of the lateral ventricle is slightly larger than its counterpart on the left, a presumed anatomic variant. Vascular: No hyperdense vessel.  No evident vascular calcification. Skull: Bony calvarium appears intact. There is a right frontal exostosis without surrounding mass effect or edema. Sinuses/Orbits: There is mucosal thickening involving several ethmoid air cells bilaterally. Visualized paranasal sinuses elsewhere are clear. Orbits appear symmetric bilaterally. Other: Mastoid air cells are clear. IMPRESSION: 1. Posterior fossa extra-axial cyst, a benign finding with chronic remodeling in the adjacent cerebellar hemispheres. No other evident mass. No hemorrhage. No acute infarct. 2.  Small right frontal bony exostosis, not felt to have clinical significance. 3.  Mild mucosal thickening in several ethmoid air cells. Electronically Signed   By: Lowella Grip III M.D.   On: 07/18/2018 09:12    Procedures Procedures (including critical care time)  Medications Ordered in ED Medications  lactated ringers bolus 1,000 mL (0 mLs Intravenous Stopped 07/18/18 1028)  metoCLOPramide (REGLAN) injection 10 mg (10 mg Intravenous Given 07/18/18 0927)  potassium chloride SA (K-DUR,KLOR-CON) CR tablet 40 mEq (40 mEq Oral Given 07/18/18 1119)     Initial Impression / Assessment and Plan / ED Course  I have reviewed the triage vital signs and the nursing notes.  Pertinent labs & imaging results that were available during my care of the patient were reviewed by me and considered in my medical decision making (see chart for details).     Patient here for evaluation of headache, feeling off balance, nausea and vomiting. He has multiple prior ED visits for nausea and vomiting. He is non-toxic appearing on examination, mildly dehydrated appearing. He has no focal neurologic deficits. Presentation is not consistent with subarachnoid hemorrhage, meningitis. CT  head does note into normal finding of arachnoid cyst. He does have mild hypokalemia. Current presentation is not consistent with bowel obstruction or acute abdomen. He is able to tolerate oral fluids after treatment in the emergency department. Counseled patient on home care for nausea, vomiting, hypokalemia. Discouraged marijuana uses this may be a contributing factor. Discussed G.I. follow-up. Discussed incidental finding of arachnoid cyst. Discussed PCP follow-up. Return precautions discussed.  Final Clinical Impressions(s) / ED Diagnoses   Final diagnoses:  Nausea  Hypokalemia  Arachnoid cyst    ED Discharge Orders         Ordered    promethazine (PHENERGAN) 25 MG tablet  Every 6 hours PRN     07/18/18 1131           Quintella Reichert, MD 07/18/18 1135

## 2018-07-18 NOTE — ED Triage Notes (Signed)
Pt seen here yesterday for same. Pt states he has had nausea and vomiting since Monday. Taking zofran with no relief to nausea. Pt states he is dehydrated. Has a follow up appointment scheduled for Tuesday.

## 2018-07-18 NOTE — ED Notes (Signed)
Pt getting dressed and refused wheelchair upon discharge

## 2018-07-18 NOTE — ED Notes (Signed)
Pt ambulated down the hall without assistance states he felt a little dizzy but feels much better. MD informed.

## 2018-07-19 ENCOUNTER — Emergency Department (HOSPITAL_COMMUNITY)
Admission: EM | Admit: 2018-07-19 | Discharge: 2018-07-20 | Disposition: A | Discharge status: Home / Self Care | Payer: 59 | Attending: Emergency Medicine | Admitting: Emergency Medicine

## 2018-07-19 ENCOUNTER — Encounter (HOSPITAL_COMMUNITY): Payer: Self-pay | Admitting: Emergency Medicine

## 2018-07-19 ENCOUNTER — Other Ambulatory Visit: Payer: Self-pay

## 2018-07-19 DIAGNOSIS — Z87891 Personal history of nicotine dependence: Secondary | ICD-10-CM | POA: Insufficient documentation

## 2018-07-19 DIAGNOSIS — R42 Dizziness and giddiness: Secondary | ICD-10-CM | POA: Diagnosis present

## 2018-07-19 DIAGNOSIS — H81399 Other peripheral vertigo, unspecified ear: Secondary | ICD-10-CM | POA: Insufficient documentation

## 2018-07-19 DIAGNOSIS — Z9049 Acquired absence of other specified parts of digestive tract: Secondary | ICD-10-CM | POA: Diagnosis not present

## 2018-07-19 DIAGNOSIS — G930 Cerebral cysts: Secondary | ICD-10-CM

## 2018-07-19 DIAGNOSIS — E876 Hypokalemia: Secondary | ICD-10-CM | POA: Diagnosis not present

## 2018-07-19 NOTE — ED Triage Notes (Addendum)
Pt states he was seen Friday for same symptoms although he is now "worse than before"  Had incidental finding on CT of subarachnoid cyst.  Had felt better after treatment and then today at 2000 had sudden onset of return of headache and dizziness accompanied by nausea/vomiting.  Pt states "his whole world is spinning"

## 2018-07-20 LAB — CBC
HEMATOCRIT: 51.9 % (ref 39.0–52.0)
Hemoglobin: 17.9 g/dL — ABNORMAL HIGH (ref 13.0–17.0)
MCH: 30.2 pg (ref 26.0–34.0)
MCHC: 34.5 g/dL (ref 30.0–36.0)
MCV: 87.7 fL (ref 80.0–100.0)
Platelets: 198 10*3/uL (ref 150–400)
RBC: 5.92 MIL/uL — ABNORMAL HIGH (ref 4.22–5.81)
RDW: 12.9 % (ref 11.5–15.5)
WBC: 14.1 10*3/uL — AB (ref 4.0–10.5)
nRBC: 0 % (ref 0.0–0.2)

## 2018-07-20 LAB — BASIC METABOLIC PANEL
Anion gap: 10 (ref 5–15)
BUN: 10 mg/dL (ref 6–20)
CHLORIDE: 105 mmol/L (ref 98–111)
CO2: 20 mmol/L — ABNORMAL LOW (ref 22–32)
CREATININE: 1.03 mg/dL (ref 0.61–1.24)
Calcium: 9.2 mg/dL (ref 8.9–10.3)
GFR calc Af Amer: >60 mL/min (ref >60)
GFR calc non Af Amer: >60 mL/min (ref >60)
GLUCOSE: 146 mg/dL — AB (ref 70–99)
Potassium: 3.2 mmol/L — ABNORMAL LOW (ref 3.5–5.1)
SODIUM: 135 mmol/L (ref 135–145)

## 2018-07-20 LAB — URINALYSIS, ROUTINE W REFLEX MICROSCOPIC
BACTERIA UA: NONE SEEN
Bilirubin Urine: NEGATIVE
Glucose, UA: NEGATIVE mg/dL
Hgb urine dipstick: NEGATIVE
Ketones, ur: 20 mg/dL — AB
Leukocytes, UA: NEGATIVE
NITRITE: NEGATIVE
PROTEIN: 30 mg/dL — AB
SPECIFIC GRAVITY, URINE: 1.02 (ref 1.005–1.030)
pH: 9 — ABNORMAL HIGH (ref 5.0–8.0)

## 2018-07-20 MED ORDER — METOCLOPRAMIDE HCL 5 MG/ML IJ SOLN
10.0000 mg | Freq: Once | INTRAMUSCULAR | Status: AC
  Administered 2018-07-20: 10 mg via INTRAVENOUS
  Filled 2018-07-20: qty 2

## 2018-07-20 MED ORDER — POTASSIUM CHLORIDE 10 MEQ/100ML IV SOLN
10.0000 meq | Freq: Once | INTRAVENOUS | Status: AC
  Administered 2018-07-20: 10 meq via INTRAVENOUS
  Filled 2018-07-20: qty 100

## 2018-07-20 MED ORDER — SODIUM CHLORIDE 0.9 % IV BOLUS
1000.0000 mL | Freq: Once | INTRAVENOUS | Status: AC
  Administered 2018-07-20: 1000 mL via INTRAVENOUS

## 2018-07-20 MED ORDER — ONDANSETRON HCL 4 MG/2ML IJ SOLN: 4.0000 mg | Freq: Once | INTRAMUSCULAR | Status: DC

## 2018-07-20 MED ORDER — ONDANSETRON HCL 4 MG/2ML IJ SOLN
4.0000 mg | Freq: Once | INTRAMUSCULAR | Status: AC
  Administered 2018-07-20: 4 mg via INTRAVENOUS

## 2018-07-20 MED ORDER — METOCLOPRAMIDE HCL 10 MG PO TABS: 10.0000 mg | ORAL_TABLET | Freq: Four times a day (QID) | ORAL | 0 refills | Status: AC | PRN

## 2018-07-20 MED ORDER — LORAZEPAM 2 MG/ML IJ SOLN
1.0000 mg | Freq: Once | INTRAMUSCULAR | Status: AC
  Administered 2018-07-20: 1 mg via INTRAVENOUS
  Filled 2018-07-20: qty 1

## 2018-07-20 MED ORDER — LORAZEPAM 1 MG PO TABS: 1.0000 mg | ORAL_TABLET | Freq: Three times a day (TID) | ORAL | 0 refills | Status: AC | PRN

## 2018-07-20 MED ORDER — POTASSIUM CHLORIDE CRYS ER 20 MEQ PO TBCR
40.0000 meq | EXTENDED_RELEASE_TABLET | Freq: Once | ORAL | Status: AC
  Administered 2018-07-20: 40 meq via ORAL
  Filled 2018-07-20: qty 2

## 2018-07-20 MED ORDER — MECLIZINE HCL 25 MG PO TABS
25.0000 mg | ORAL_TABLET | Freq: Once | ORAL | Status: AC
  Administered 2018-07-20: 25 mg via ORAL
  Filled 2018-07-20: qty 1

## 2018-07-20 MED ORDER — MECLIZINE HCL 25 MG PO TABS: 25.0000 mg | ORAL_TABLET | Freq: Three times a day (TID) | ORAL | 0 refills | Status: AC | PRN

## 2018-07-20 NOTE — ED Provider Notes (Signed)
[facility] EMERGENCY DEPARTMENT Provider Note   CSN: [id number] Arrival date & time: 07/19/18  2338     History   Chief Complaint Chief Complaint  Patient presents with  . Headache  . Dizziness    HPI Steven Black is a 42 y.o. male.  The history is provided by the patient.  Headache    Dizziness  Associated symptoms: headaches   He had sudden onset about 8 PM of severe nausea and vomiting with associated dizziness that felt like he just got off of a Til-A-Whirl.  He had promethazine suppository which he took, but it did not help.  He has been having similar symptoms for the past week and had been seen in the emergency department yesterday and received IV fluids and metoclopramide and generally felt well until tonight.  Prior to that, he had failed to improve with ondansetron.  He has also had similar episodes intermittently in the past and has been seen in the emergency several times for similar symptoms.  He denies ear pain or tinnitus.  He denies hearing loss.  Past Medical History:  Diagnosis Date  . Emesis     There are no active problems to display for this patient.   Past Surgical History:  Procedure Laterality Date  . CHOLECYSTECTOMY          Home Medications    Prior to Admission medications   Medication Sig Start Date End Date Taking? Authorizing Provider  famotidine (PEPCID) 20 MG tablet Take 1 tablet (20 mg total) by mouth 2 (two) times daily. 07/17/18   McDonald, Mia A, PA-C  promethazine (PHENERGAN) 25 MG tablet Take 1 tablet (25 mg total) by mouth every 6 (six) hours as needed for nausea or vomiting. 07/18/18   Quintella Reichert, MD    Family History No family history on file.  Social History Social History   Tobacco Use  . Smoking status: Former Research scientist (life sciences)  . Smokeless tobacco: Never Used  . Tobacco comment: vape with nicotine  Substance Use Topics  . Alcohol use: No  . Drug use: Yes    Types: Marijuana     Allergies     Patient has no known allergies.   Review of Systems Review of Systems  Neurological: Positive for dizziness and headaches.  All other systems reviewed and are negative.    Physical Exam Updated Vital Signs BP (!) 157/99 (BP Location: Right Arm)   Pulse 89   Temp 98 F (36.7 C) (Oral)   Resp (!) 22   SpO2 100%   Physical Exam  Nursing note and vitals reviewed.  Uncomfortable appearing 42 year old male, in no acute distress. Vital signs are significant for elevated blood pressure and respiratory rate. Oxygen saturation is 100%, which is normal. Head is normocephalic and atraumatic. PERRLA, EOMI. Oropharynx is clear. Neck is nontender and supple without adenopathy or JVD. Back is nontender and there is no CVA tenderness. Lungs are clear without rales, wheezes, or rhonchi. Chest is nontender. Heart has regular rate and rhythm without murmur. Abdomen is soft, flat, nontender without masses or hepatosplenomegaly and peristalsis is normoactive. Extremities have no cyanosis or edema, full range of motion is present. Skin is pale, warm, and dry without rash. Neurologic: Mental status is normal, cranial nerves are intact, there are no motor or sensory deficits.  Dizziness is reproduced by passive head movement.  ED Treatments / Results  Labs (all labs ordered are listed, but only abnormal results are displayed) Labs Reviewed  BASIC METABOLIC PANEL - Abnormal; Notable for the following components:      Result Value   Potassium 3.2 (*)    CO2 20 (*)    Glucose, Bld 146 (*)    All other components within normal limits  CBC - Abnormal; Notable for the following components:   WBC 14.1 (*)    RBC 5.92 (*)    Hemoglobin 17.9 (*)    All other components within normal limits  URINALYSIS, ROUTINE W REFLEX MICROSCOPIC    EKG None  Radiology Dg Abdomen 1 View  Result Date: 07/18/2018 CLINICAL DATA:  Nausea, vomiting, abdominal pain EXAM: ABDOMEN - 1 VIEW COMPARISON:  11/27/2017  FINDINGS: Prior cholecystectomy. Calcified phleboliths in the lower pelvis, stable. There is a non obstructive bowel gas pattern. No supine evidence of free air. No organomegaly or suspicious calcification. No acute bony abnormality. IMPRESSION: No acute findings. Electronically Signed   By: Rolm Baptise M.D.   On: 07/18/2018 10:16   Ct Head Wo Contrast  Result Date: 07/18/2018 CLINICAL DATA:  Headache with nausea and vomiting EXAM: CT HEAD WITHOUT CONTRAST TECHNIQUE: Contiguous axial images were obtained from the base of the skull through the vertex without intravenous contrast. COMPARISON:  None. FINDINGS: Brain: The ventricles are normal in size and configuration. 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 There is no other evident mass. There is no intra-axial mass or hemorrhage. There is no subdural or epidural fluid collection. No midline shift. Brain parenchyma appears unremarkable. No evident acute infarct. Right temporal horn of the lateral ventricle is slightly larger than its counterpart on the left, a presumed anatomic variant. Vascular: No hyperdense vessel.  No evident vascular calcification. Skull: Bony calvarium appears intact. There is a right frontal exostosis without surrounding mass effect or edema. Sinuses/Orbits: There is mucosal thickening involving several ethmoid air cells bilaterally. Visualized paranasal sinuses elsewhere are clear. Orbits appear symmetric bilaterally. Other: Mastoid air cells are clear. IMPRESSION: 1. Posterior fossa extra-axial cyst, a benign finding with chronic remodeling in the adjacent cerebellar hemispheres. No other evident mass. No hemorrhage. No acute infarct. 2. Small right frontal bony exostosis, not felt to have clinical significance. 3.  Mild mucosal thickening in several ethmoid air cells. Electronically Signed   By: Lowella Grip III M.D.   On: 07/18/2018 09:12    Procedures Procedures   Medications  Ordered in ED Medications  sodium chloride 0.9 % bolus 1,000 mL (has no administration in time range)  meclizine (ANTIVERT) tablet 25 mg (has no administration in time range)  potassium chloride 10 mEq in 100 mL IVPB (has no administration in time range)  metoCLOPramide (REGLAN) injection 10 mg (has no administration in time range)     Initial Impression / Assessment and Plan / ED Course  I have reviewed the triage vital signs and the nursing notes.  Pertinent labs & imaging results that were available during my care of the patient were reviewed by me and considered in my medical decision making (see chart for details).  Nausea, vomiting, dizziness which appears to be peripheral vertigo.  Old records are reviewed confirming several ED recent ED visits for intractable nausea.  CT scan of head yesterday showed incidental finding of arachnoid cyst.  No red flags to suggest more serious pathology.  He will be given IV fluids, metoclopramide and oral meclizine.  Labs today do show elevated glucose and low potassium.  He will be given intravenous potassium.  Glucose is noted to have been mildly elevated multiple times over the last 6 months.  He will need to be followed by his PCP for possible prediabetes.  2:13 AM There was partial relief with above-noted treatment.  He is given a dose of lorazepam.  He also had recurrence of nausea, and is given a dose of ondansetron.  3:58 AM He is feeling much better after above-noted treatment.  He is discharged with prescriptions for meclizine, lorazepam, metoclopramide.  Return precautions discussed.  Final Clinical Impressions(s) / ED Diagnoses   Final diagnoses:  Peripheral vertigo, unspecified laterality  Hypokalemia  Arachnoid cyst    ED Discharge Orders         Ordered    meclizine (ANTIVERT) 25 MG tablet  3 times daily PRN     07/20/18 0356    LORazepam (ATIVAN) 1 MG tablet  3 times daily PRN     07/20/18 0356    metoCLOPramide (REGLAN) 10  MG tablet  Every 6 hours PRN     07/20/18 4383           Delora Fuel, MD 81/84/03 [id number]

## 2018-07-20 NOTE — Discharge Instructions (Addendum)
Return if symptoms are not being adequately controlled at home. °

## 2018-07-22 ENCOUNTER — Encounter (HOSPITAL_COMMUNITY): Payer: Self-pay | Admitting: *Deleted

## 2018-07-22 ENCOUNTER — Other Ambulatory Visit: Payer: Self-pay

## 2018-07-22 ENCOUNTER — Emergency Department (HOSPITAL_COMMUNITY)
Admission: EM | Admit: 2018-07-22 | Discharge: 2018-07-22 | Disposition: A | Discharge status: Home / Self Care | Payer: 59 | Attending: Emergency Medicine | Admitting: Emergency Medicine

## 2018-07-22 DIAGNOSIS — R1115 Cyclical vomiting syndrome unrelated to migraine: Secondary | ICD-10-CM

## 2018-07-22 DIAGNOSIS — G43A0 Cyclical vomiting, not intractable: Secondary | ICD-10-CM | POA: Diagnosis not present

## 2018-07-22 DIAGNOSIS — F17210 Nicotine dependence, cigarettes, uncomplicated: Secondary | ICD-10-CM | POA: Diagnosis not present

## 2018-07-22 DIAGNOSIS — R109 Unspecified abdominal pain: Secondary | ICD-10-CM | POA: Diagnosis present

## 2018-07-22 DIAGNOSIS — Z79899 Other long term (current) drug therapy: Secondary | ICD-10-CM | POA: Diagnosis not present

## 2018-07-22 LAB — COMPREHENSIVE METABOLIC PANEL
ALT: 37 U/L (ref 0–44)
ANION GAP: 13 (ref 5–15)
AST: 28 U/L (ref 15–41)
Albumin: 4.4 g/dL (ref 3.5–5.0)
Alkaline Phosphatase: 49 U/L (ref 38–126)
BUN: 9 mg/dL (ref 6–20)
CO2: 16 mmol/L — AB (ref 22–32)
Calcium: 9.4 mg/dL (ref 8.9–10.3)
Chloride: 109 mmol/L (ref 98–111)
Creatinine, Ser: 1.02 mg/dL (ref 0.61–1.24)
GFR calc Af Amer: >60 mL/min (ref >60)
Glucose, Bld: 121 mg/dL — ABNORMAL HIGH (ref 70–99)
POTASSIUM: 3.6 mmol/L (ref 3.5–5.1)
SODIUM: 138 mmol/L (ref 135–145)
Total Bilirubin: 0.8 mg/dL (ref 0.3–1.2)
Total Protein: 7 g/dL (ref 6.5–8.1)

## 2018-07-22 LAB — URINALYSIS, ROUTINE W REFLEX MICROSCOPIC
BILIRUBIN URINE: NEGATIVE
Glucose, UA: NEGATIVE mg/dL
Hgb urine dipstick: NEGATIVE
KETONES UR: 5 mg/dL — AB
LEUKOCYTES UA: NEGATIVE
NITRITE: NEGATIVE
PH: 9 — AB (ref 5.0–8.0)
PROTEIN: NEGATIVE mg/dL
Specific Gravity, Urine: 1.015 (ref 1.005–1.030)

## 2018-07-22 LAB — CBC
HCT: 52.7 % — ABNORMAL HIGH (ref 39.0–52.0)
HEMOGLOBIN: 17.3 g/dL — AB (ref 13.0–17.0)
MCH: 30.1 pg (ref 26.0–34.0)
MCHC: 32.8 g/dL (ref 30.0–36.0)
MCV: 91.7 fL (ref 80.0–100.0)
Platelets: 236 10*3/uL (ref 150–400)
RBC: 5.75 MIL/uL (ref 4.22–5.81)
RDW: 13.2 % (ref 11.5–15.5)
WBC: 16.9 10*3/uL — AB (ref 4.0–10.5)
nRBC: 0 % (ref 0.0–0.2)

## 2018-07-22 LAB — LIPASE, BLOOD: LIPASE: 43 U/L (ref 11–51)

## 2018-07-22 MED ORDER — ONDANSETRON 4 MG PO TBDP
4.0000 mg | ORAL_TABLET | Freq: Once | ORAL | Status: AC
  Administered 2018-07-22: 4 mg via ORAL
  Filled 2018-07-22: qty 1

## 2018-07-22 MED ORDER — HALOPERIDOL LACTATE 5 MG/ML IJ SOLN
5.0000 mg | Freq: Once | INTRAMUSCULAR | Status: AC
  Administered 2018-07-22: 5 mg via INTRAVENOUS
  Filled 2018-07-22: qty 1

## 2018-07-22 MED ORDER — METOCLOPRAMIDE HCL 5 MG/ML IJ SOLN
10.0000 mg | Freq: Four times a day (QID) | INTRAMUSCULAR | Status: DC | PRN
  Administered 2018-07-22: 10 mg via INTRAVENOUS
  Filled 2018-07-22: qty 2

## 2018-07-22 MED ORDER — SODIUM CHLORIDE 0.9 % IV SOLN
INTRAVENOUS | Status: DC | PRN
  Administered 2018-07-22: 30 mL via INTRAVENOUS

## 2018-07-22 MED ORDER — DEXTROSE 5 % AND 0.45 % NACL IV BOLUS
1000.0000 mL | Freq: Once | INTRAVENOUS | Status: AC
  Administered 2018-07-22: 1000 mL via INTRAVENOUS

## 2018-07-22 MED ORDER — FAMOTIDINE IN NACL 20-0.9 MG/50ML-% IV SOLN
20.0000 mg | Freq: Once | INTRAVENOUS | Status: AC
  Administered 2018-07-22: 20 mg via INTRAVENOUS
  Filled 2018-07-22: qty 50

## 2018-07-22 NOTE — ED Provider Notes (Addendum)
[facility] EMERGENCY DEPARTMENT Provider Note   CSN: [id number] Arrival date & time: 07/22/18  2993     History   Chief Complaint Chief Complaint  Patient presents with  . Abdominal Pain    HPI Steven Black is a 42 y.o. male.  HPI  42 year old male comes in with chief complaint of abdominal pain, nausea and vomiting. Patient reports that he was doing well until suddenly around 8:00 while he was at his PCP clinic he started having an outburst of nausea, vomiting and abdominal discomfort.  Patient's abdominal discomfort is described as burning type pain which starts after he has had emesis.  Patient reports more than 10 episodes of emesis, nonbilious.  He denies any headache, but does feel some warmth in his head.  Patient states that he has been having the symptoms for the past several months.  Over the past few weeks however his symptoms have become more frequent and the outburst more severe.  This is patient's fourth visit in the ED in the last 2 months with same type of symptoms.  Generally his symptoms started improving once he is in the ER, but at home he states that oral nausea medications are not helpful.  Past Medical History:  Diagnosis Date  . Emesis     There are no active problems to display for this patient.   Past Surgical History:  Procedure Laterality Date  . CHOLECYSTECTOMY          Home Medications    Prior to Admission medications   Medication Sig Start Date End Date Taking? Authorizing Provider  LORazepam (ATIVAN) 1 MG tablet Take 1 tablet (1 mg total) by mouth 3 (three) times daily as needed for anxiety (or vertigo). 71/6/96  Yes Delora Fuel, MD  meclizine (ANTIVERT) 25 MG tablet Take 1 tablet (25 mg total) by mouth 3 (three) times daily as needed for dizziness. 78/9/38  Yes Delora Fuel, MD  metoCLOPramide (REGLAN) 10 MG tablet Take 1 tablet (10 mg total) by mouth every 6 (six) hours as needed for nausea (or headache). 06/17/74   Yes Delora Fuel, MD  promethazine (PHENERGAN) 25 MG tablet Take 1 tablet (25 mg total) by mouth every 6 (six) hours as needed for nausea or vomiting. 07/18/18  Yes Quintella Reichert, MD  famotidine (PEPCID) 20 MG tablet Take 1 tablet (20 mg total) by mouth 2 (two) times daily. Patient not taking: Reported on 07/22/2018 07/17/18   Joanne Gavel, PA-C    Family History No family history on file.  Social History Social History   Tobacco Use  . Smoking status: Current Every Day Smoker    Packs/day: 0.50    Types: Cigarettes  . Smokeless tobacco: Never Used  . Tobacco comment: vape with nicotine  Substance Use Topics  . Alcohol use: No  . Drug use: Yes    Types: Marijuana    Comment: last use " a couple" of months     Allergies   Patient has no known allergies.   Review of Systems Review of Systems  Constitutional: Positive for activity change.  Respiratory: Negative for shortness of breath.   Cardiovascular: Negative for chest pain.  Gastrointestinal: Positive for abdominal pain, nausea and vomiting. Negative for diarrhea.  Allergic/Immunologic: Negative for immunocompromised state.  Hematological: Does not bruise/bleed easily.  All other systems reviewed and are negative.    Physical Exam Updated Vital Signs BP 125/69   Pulse 65   Resp (!) 22   Ht 6'  7" (2.007 m)   Wt 99.8 kg   SpO2 96%   BMI 24.78 kg/m   Physical Exam  Constitutional: He is oriented to person, place, and time. He appears well-developed.  HENT:  Head: Atraumatic.  Neck: Neck supple.  Cardiovascular: Normal rate.  Pulmonary/Chest: Effort normal.  Abdominal: Normal appearance and bowel sounds are normal. There is tenderness in the right upper quadrant, epigastric area and left upper quadrant. There is no rebound and no guarding.  Neurological: He is alert and oriented to person, place, and time.  Skin: Skin is warm. Capillary refill takes 2 to 3 seconds.  Nursing note and vitals  reviewed.    ED Treatments / Results  Labs (all labs ordered are listed, but only abnormal results are displayed) Labs Reviewed  COMPREHENSIVE METABOLIC PANEL - Abnormal; Notable for the following components:      Result Value   CO2 16 (*)    Glucose, Bld 121 (*)    All other components within normal limits  CBC - Abnormal; Notable for the following components:   WBC 16.9 (*)    Hemoglobin 17.3 (*)    HCT 52.7 (*)    All other components within normal limits  URINALYSIS, ROUTINE W REFLEX MICROSCOPIC - Abnormal; Notable for the following components:   pH 9.0 (*)    Ketones, ur 5 (*)    All other components within normal limits  LIPASE, BLOOD    EKG None  Radiology No results found.  Procedures Procedures (including critical care time)  Medications Ordered in ED Medications  ondansetron (ZOFRAN-ODT) disintegrating tablet 4 mg (4 mg Oral Given 07/22/18 0954)  haloperidol lactate (HALDOL) injection 5 mg (5 mg Intravenous Given 07/22/18 1245)  dextrose 5 % and 0.45% NaCl 5-0.45 % bolus 1,000 mL (0 mLs Intravenous Stopped 07/22/18 1447)  famotidine (PEPCID) IVPB 20 mg premix ( Intravenous Stopped 07/22/18 1328)     Initial Impression / Assessment and Plan / ED Course  I have reviewed the triage vital signs and the nursing notes.  Pertinent labs & imaging results that were available during my care of the patient were reviewed by me and considered in my medical decision making (see chart for details).     42 year old male comes in with chief complaint of nausea, vomiting.  He has had similar episodes in the past and does not have any significant medical history.  Patient has had same type of visit in the ED within the last few months.  He has been treated as if he has cyclic vomiting syndrome.  Patient states that he has not had any marijuana since June.  On exam abdomen is soft and there is no peritoneal findings.  Patient appears uncomfortable because of his nausea and  vomiting.  We will give him some Haldol and reassess.  Besides cannabinoid hyperemesis syndrome, differential would include cyclic vomiting syndrome due to unknown etiology and gastroparesis.  @2 :15 After Haldol patient's symptoms resolved.  He is noted to be hemoconcentrated and has mild ketonuria.  Patient wants to go home.  I had already spoken with GI, and Dr. Therisa Doyne will have her clinic call the patient to set up an appointment.  Final Clinical Impressions(s) / ED Diagnoses   Final diagnoses:  Cyclic vomiting syndrome    ED Discharge Orders    None       Varney Biles, MD 07/22/18 [place], Aydenn Gervin, MD 07/22/18 [id number]

## 2018-07-22 NOTE — Discharge Instructions (Signed)
We saw in the ER for nausea and vomiting. It appears to [place] that you are having cyclic vomiting syndrome due to unknown cause. Please follow-up with the GI doctors as planned.

## 2018-07-22 NOTE — ED Triage Notes (Signed)
Pt in c/o emesis and abd pain onset x 1 wk, pt reports continuous vomiting over the last 24 hrs, denies diarrhea, A&O x4

## 2018-07-24 ENCOUNTER — Other Ambulatory Visit: Payer: Self-pay

## 2018-07-24 ENCOUNTER — Emergency Department (HOSPITAL_COMMUNITY): Payer: 59

## 2018-07-24 ENCOUNTER — Observation Stay (HOSPITAL_COMMUNITY)
Admission: EM | Admit: 2018-07-24 | Discharge: 2018-07-25 | Disposition: A | Discharge status: Home / Self Care | Payer: 59 | Attending: Internal Medicine | Admitting: Internal Medicine

## 2018-07-24 ENCOUNTER — Encounter (HOSPITAL_COMMUNITY): Payer: Self-pay | Admitting: *Deleted

## 2018-07-24 DIAGNOSIS — R1110 Vomiting, unspecified: Secondary | ICD-10-CM | POA: Diagnosis not present

## 2018-07-24 DIAGNOSIS — F17210 Nicotine dependence, cigarettes, uncomplicated: Secondary | ICD-10-CM | POA: Insufficient documentation

## 2018-07-24 DIAGNOSIS — Z79899 Other long term (current) drug therapy: Secondary | ICD-10-CM | POA: Diagnosis not present

## 2018-07-24 DIAGNOSIS — Z9049 Acquired absence of other specified parts of digestive tract: Secondary | ICD-10-CM | POA: Diagnosis not present

## 2018-07-24 DIAGNOSIS — R51 Headache: Secondary | ICD-10-CM | POA: Diagnosis not present

## 2018-07-24 DIAGNOSIS — F1210 Cannabis abuse, uncomplicated: Principal | ICD-10-CM | POA: Diagnosis present

## 2018-07-24 DIAGNOSIS — R112 Nausea with vomiting, unspecified: Secondary | ICD-10-CM | POA: Insufficient documentation

## 2018-07-24 DIAGNOSIS — F419 Anxiety disorder, unspecified: Secondary | ICD-10-CM | POA: Diagnosis not present

## 2018-07-24 DIAGNOSIS — F12188 Cannabis abuse with other cannabis-induced disorder: Secondary | ICD-10-CM | POA: Diagnosis present

## 2018-07-24 DIAGNOSIS — M899 Disorder of bone, unspecified: Secondary | ICD-10-CM | POA: Insufficient documentation

## 2018-07-24 DIAGNOSIS — Q049 Congenital malformation of brain, unspecified: Secondary | ICD-10-CM

## 2018-07-24 DIAGNOSIS — G930 Cerebral cysts: Secondary | ICD-10-CM | POA: Diagnosis not present

## 2018-07-24 DIAGNOSIS — R109 Unspecified abdominal pain: Secondary | ICD-10-CM | POA: Diagnosis not present

## 2018-07-24 HISTORY — DX: Congenital malformation of brain, unspecified: Q04.9

## 2018-07-24 HISTORY — DX: Cannabis abuse with other cannabis-induced disorder: F12.188

## 2018-07-24 LAB — COMPREHENSIVE METABOLIC PANEL
ALK PHOS: 41 U/L (ref 38–126)
ALT: 39 U/L (ref 0–44)
AST: 30 U/L (ref 15–41)
Albumin: 4.3 g/dL (ref 3.5–5.0)
Anion gap: 10 (ref 5–15)
BILIRUBIN TOTAL: 1 mg/dL (ref 0.3–1.2)
BUN: 7 mg/dL (ref 6–20)
CALCIUM: 9.5 mg/dL (ref 8.9–10.3)
CO2: 21 mmol/L — ABNORMAL LOW (ref 22–32)
CREATININE: 1.03 mg/dL (ref 0.61–1.24)
Chloride: 103 mmol/L (ref 98–111)
GFR calc non Af Amer: >60 mL/min (ref >60)
Glucose, Bld: 118 mg/dL — ABNORMAL HIGH (ref 70–99)
Potassium: 3.2 mmol/L — ABNORMAL LOW (ref 3.5–5.1)
SODIUM: 134 mmol/L — AB (ref 135–145)
TOTAL PROTEIN: 6.7 g/dL (ref 6.5–8.1)

## 2018-07-24 LAB — CBC
HCT: 52.4 % — ABNORMAL HIGH (ref 39.0–52.0)
Hemoglobin: 17.5 g/dL — ABNORMAL HIGH (ref 13.0–17.0)
MCH: 29.6 pg (ref 26.0–34.0)
MCHC: 33.4 g/dL (ref 30.0–36.0)
MCV: 88.7 fL (ref 80.0–100.0)
NRBC: 0 % (ref 0.0–0.2)
PLATELETS: 262 10*3/uL (ref 150–400)
RBC: 5.91 MIL/uL — ABNORMAL HIGH (ref 4.22–5.81)
RDW: 13.2 % (ref 11.5–15.5)
WBC: 12.8 10*3/uL — ABNORMAL HIGH (ref 4.0–10.5)

## 2018-07-24 LAB — LIPASE, BLOOD: Lipase: 37 U/L (ref 11–51)

## 2018-07-24 MED ORDER — SODIUM CHLORIDE 0.9 % IV BOLUS
1000.0000 mL | Freq: Once | INTRAVENOUS | Status: AC
  Administered 2018-07-24: 1000 mL via INTRAVENOUS

## 2018-07-24 MED ORDER — FAMOTIDINE IN NACL 20-0.9 MG/50ML-% IV SOLN
20.0000 mg | Freq: Once | INTRAVENOUS | Status: AC
  Administered 2018-07-24: 20 mg via INTRAVENOUS
  Filled 2018-07-24: qty 50

## 2018-07-24 MED ORDER — HYDROCODONE-ACETAMINOPHEN 5-325 MG PO TABS: 1.0000 | ORAL_TABLET | ORAL | Status: DC | PRN

## 2018-07-24 MED ORDER — CAPSAICIN 0.025 % EX CREA
TOPICAL_CREAM | Freq: Once | CUTANEOUS | Status: DC
  Filled 2018-07-24: qty 60

## 2018-07-24 MED ORDER — SODIUM CHLORIDE 0.9 % IV SOLN
INTRAVENOUS | Status: DC
  Administered 2018-07-24 – 2018-07-25 (×2): via INTRAVENOUS

## 2018-07-24 MED ORDER — METOCLOPRAMIDE HCL 5 MG/ML IJ SOLN
5.0000 mg | Freq: Three times a day (TID) | INTRAMUSCULAR | Status: DC
  Administered 2018-07-24 – 2018-07-25 (×3): 5 mg via INTRAVENOUS
  Filled 2018-07-24 (×3): qty 2

## 2018-07-24 MED ORDER — POTASSIUM CHLORIDE 20 MEQ PO PACK
20.0000 meq | PACK | Freq: Two times a day (BID) | ORAL | Status: AC
  Administered 2018-07-24 (×2): 20 meq via ORAL
  Filled 2018-07-24 (×3): qty 1

## 2018-07-24 MED ORDER — ONDANSETRON HCL 4 MG/2ML IJ SOLN: 4.0000 mg | Freq: Four times a day (QID) | INTRAMUSCULAR | Status: DC | PRN

## 2018-07-24 MED ORDER — LORAZEPAM 2 MG/ML IJ SOLN: 0.5000 mg | INTRAMUSCULAR | Status: DC | PRN

## 2018-07-24 MED ORDER — HALOPERIDOL LACTATE 5 MG/ML IJ SOLN
2.0000 mg | Freq: Once | INTRAMUSCULAR | Status: AC
  Administered 2018-07-24: 2 mg via INTRAVENOUS
  Filled 2018-07-24: qty 1

## 2018-07-24 MED ORDER — GADOBUTROL 1 MMOL/ML IV SOLN
10.0000 mL | Freq: Once | INTRAVENOUS | Status: AC | PRN
  Administered 2018-07-24: 10 mL via INTRAVENOUS

## 2018-07-24 MED ORDER — FAMOTIDINE 20 MG PO TABS
20.0000 mg | ORAL_TABLET | Freq: Two times a day (BID) | ORAL | Status: DC
  Administered 2018-07-24 – 2018-07-25 (×3): 20 mg via ORAL
  Filled 2018-07-24 (×3): qty 1

## 2018-07-24 MED ORDER — LORAZEPAM 2 MG/ML IJ SOLN
1.0000 mg | Freq: Once | INTRAMUSCULAR | Status: AC
  Administered 2018-07-24: 1 mg via INTRAVENOUS
  Filled 2018-07-24: qty 1

## 2018-07-24 MED ORDER — ACETAMINOPHEN 325 MG PO TABS: 650.0000 mg | ORAL_TABLET | Freq: Four times a day (QID) | ORAL | Status: DC | PRN

## 2018-07-24 MED ORDER — ONDANSETRON HCL 4 MG PO TABS: 4.0000 mg | ORAL_TABLET | Freq: Four times a day (QID) | ORAL | Status: DC | PRN

## 2018-07-24 MED ORDER — ENOXAPARIN SODIUM 40 MG/0.4ML ~~LOC~~ SOLN
40.0000 mg | SUBCUTANEOUS | Status: DC
  Administered 2018-07-24: 40 mg via SUBCUTANEOUS
  Filled 2018-07-24: qty 0.4

## 2018-07-24 MED ORDER — MECLIZINE HCL 25 MG PO TABS
25.0000 mg | ORAL_TABLET | Freq: Three times a day (TID) | ORAL | Status: DC | PRN
  Filled 2018-07-24: qty 1

## 2018-07-24 MED ORDER — ACETAMINOPHEN 650 MG RE SUPP: 650.0000 mg | Freq: Four times a day (QID) | RECTAL | Status: DC | PRN

## 2018-07-24 NOTE — ED Notes (Signed)
The pt reports that he has an appointment with a gi doctor soon  He also reports that he  Is on 2 medicines for vomiting

## 2018-07-24 NOTE — ED Notes (Addendum)
Zostrix not given to patient because the last time he was here, he was highly sensitive to this cream. His skin was red and patient reported burning

## 2018-07-24 NOTE — H&P (Addendum)
[facility]                                                                                    Patient Demographics  Steven Black, is a 42 y.o. male  CSN: [id number]  MRN: [id number]  DOB - 1975-12-20  Admit Date - 07/24/2018  Outpatient Primary MD for the patient is London Pepper, MD   With History of -  Past Medical History:  Diagnosis Date  . Emesis       Past Surgical History:  Procedure Laterality Date  . CHOLECYSTECTOMY      in for   Chief Complaint  Patient presents with  . Emesis     HPI  Steven Black  is a 42 y.o. male, with past medical history significant for cyclical nausea and vomiting presenting today for evaluation for the same problem.  Patient started complaining of abdominal discomfort at around 12 midnight last night and he presented today for recurrent nausea and vomiting.  The patient reports using marijuana for the last time at the beginning of this month.  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  Patient denies any headaches, fever or chills.  Discussed with Dr. Malen Gauze from neurology by emergency room provider and he feels that it is insignificant and will follow during admission.  Patient denies any diarrhea, but he cannot keep anything in.  He started feeling better after he received Haldol and capsaicin ordered in the emergency room.  His KUB was negative and he was asking for GI work-up which I explained to him it might be arranged as an outpatient.    Review of Systems    In addition to the HPI above,  No Fever-chills, No Headache, No changes with Vision or hearing, No Chest pain, Cough or Shortness of Breath, No Blood in stool or Urine, No dysuria, No new skin rashes or bruises, No new joints pains-aches,  No new weakness, tingling, numbness in any extremity, No recent weight gain or loss, No polyuria, polydypsia or polyphagia, No significant Mental Stressors.  A full 10 point Review of Systems was done, except as  stated above, all other Review of Systems were negative.   Social History Social History   Tobacco Use  . Smoking status: Current Every Day Smoker    Packs/day: 0.50    Types: Cigarettes  . Smokeless tobacco: Never Used  . Tobacco comment: vape with nicotine  Substance Use Topics  . Alcohol use: No     Family History No family history on file.   Prior to Admission medications   Medication Sig Start Date End Date Taking? Authorizing Provider  famotidine (PEPCID) 20 MG tablet Take 1 tablet (20 mg total) by mouth 2 (two) times daily. Patient not taking: Reported on 07/22/2018 07/17/18   McDonald, Mia A, PA-C  LORazepam (ATIVAN) 1 MG tablet Take 1 tablet (1 mg total) by mouth 3 (three) times daily as needed for anxiety (or vertigo). 65/0/35   Delora Fuel, MD  meclizine (ANTIVERT) 25 MG tablet Take 1 tablet (25 mg total) by mouth 3 (three) times daily as needed for dizziness. 46/5/68   Delora Fuel, MD  metoCLOPramide (  REGLAN) 10 MG tablet Take 1 tablet (10 mg total) by mouth every 6 (six) hours as needed for nausea (or headache). 35/3/29   Delora Fuel, MD  promethazine (PHENERGAN) 25 MG tablet Take 1 tablet (25 mg total) by mouth every 6 (six) hours as needed for nausea or vomiting. 07/18/18   Quintella Reichert, MD    No Known Allergies  Physical Exam  Vitals  Blood pressure (!) 150/99, pulse 65, resp. rate 13, height 6\' 7"  (2.007 m), weight 99.8 kg, SpO2 98 %.   1. General well-developed male, looks tired  2.  Depressed affect , Not Suicidal or Homicidal, Awake Alert, Oriented X 3.  3. No F.N deficits, grossly, patient moving all extremities.  4. Ears and Eyes appear Normal, Conjunctivae clear, PERRLA. Moist Oral Mucosa, poor dental hygiene.  5. Supple Neck, No JVD, No cervical lymphadenopathy appriciated, .  6. Symmetrical Chest wall movement, Good air movement bilaterally, CTAB.  7. RRR, No Gallops, Rubs or Murmurs, No Parasternal Heave.  8. Positive Bowel Sounds,  Abdomen Soft, mild generalized tenderness around the periumbilical area.  9.  No Cyanosis, Normal Skin Turgor, No Skin Rash or Bruise.  10. Good muscle tone,  joints appear normal , no effusions, Normal ROM.    Data Review  CBC Recent Labs  Lab 07/18/18 0848 07/19/18 2353 07/22/18 1001 07/24/18 0628  WBC 11.0* 14.1* 16.9* 12.8*  HGB 16.3 17.9* 17.3* 17.5*  HCT 51.0 51.9 52.7* 52.4*  PLT 159 198 236 262  MCV 92.9 87.7 91.7 88.7  MCH 29.7 30.2 30.1 29.6  MCHC 32.0 34.5 32.8 33.4  RDW 13.2 12.9 13.2 13.2  LYMPHSABS 2.6  --   --   --   MONOABS 1.1*  --   --   --   EOSABS 0.2  --   --   --   BASOSABS 0.1  --   --   --    ------------------------------------------------------------------------------------------------------------------  Chemistries  Recent Labs  Lab 07/18/18 0848 07/19/18 2353 07/22/18 1001 07/24/18 0628  NA 136 135 138 134*  K 3.0* 3.2* 3.6 3.2*  CL 105 105 109 103  CO2 23 20* 16* 21*  GLUCOSE 106* 146* 121* 118*  BUN 10 10 9 7   CREATININE 0.88 1.03 1.02 1.03  CALCIUM 8.5* 9.2 9.4 9.5  MG 2.3  --   --   --   AST 26  --  28 30  ALT 36  --  37 39  ALKPHOS 41  --  49 41  BILITOT 1.3*  --  0.8 1.0   ------------------------------------------------------------------------------------------------------------------ estimated creatinine clearance is 123.8 mL/min (by C-G formula based on SCr of 1.03 mg/dL). ------------------------------------------------------------------------------------------------------------------ No results for input(s): TSH, T4TOTAL, T3FREE, THYROIDAB in the last 72 hours.  Invalid input(s): FREET3   Coagulation profile No results for input(s): INR, PROTIME in the last 168 hours. ------------------------------------------------------------------------------------------------------------------- No results for input(s): DDIMER in the last 72  hours. -------------------------------------------------------------------------------------------------------------------  Cardiac Enzymes No results for input(s): CKMB, TROPONINI, MYOGLOBIN in the last 168 hours.  Invalid input(s): CK ------------------------------------------------------------------------------------------------------------------ Invalid input(s): POCBNP   ---------------------------------------------------------------------------------------------------------------  Urinalysis    Component Value Date/Time   COLORURINE YELLOW 07/22/2018 [place] 07/22/2018 1035   LABSPEC 1.015 07/22/2018 1035   PHURINE 9.0 (H) 07/22/2018 1035   GLUCOSEU NEGATIVE 07/22/2018 1035   HGBUR NEGATIVE 07/22/2018 1035   BILIRUBINUR NEGATIVE 07/22/2018 1035   KETONESUR 5 (A) 07/22/2018 1035   PROTEINUR NEGATIVE 07/22/2018 1035   UROBILINOGEN 1.0 05/19/2015 1354  NITRITE NEGATIVE 07/22/2018 [place] 07/22/2018 1035    ----------------------------------------------------------------------------------------------------------------   Imaging results:   Dg Abdomen 1 View  Result Date: 07/18/2018 CLINICAL DATA:  Nausea, vomiting, abdominal pain EXAM: ABDOMEN - 1 VIEW COMPARISON:  11/27/2017 FINDINGS: Prior cholecystectomy. Calcified phleboliths in the lower pelvis, stable. There is a non obstructive bowel gas pattern. No supine evidence of free air. No organomegaly or suspicious calcification. No acute bony abnormality. IMPRESSION: No acute findings. Electronically Signed   By: Rolm Baptise M.D.   On: 07/18/2018 10:16   Ct Head Wo Contrast  Result Date: 07/18/2018 CLINICAL DATA:  Headache with nausea and vomiting EXAM: CT HEAD WITHOUT CONTRAST TECHNIQUE: Contiguous axial images were obtained from the base of the skull through the vertex without intravenous contrast. COMPARISON:  None. FINDINGS: Brain: The ventricles are normal in size and  configuration. 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 There is no other evident mass. There is no intra-axial mass or hemorrhage. There is no subdural or epidural fluid collection. No midline shift. Brain parenchyma appears unremarkable. No evident acute infarct. Right temporal horn of the lateral ventricle is slightly larger than its counterpart on the left, a presumed anatomic variant. Vascular: No hyperdense vessel.  No evident vascular calcification. Skull: Bony calvarium appears intact. There is a right frontal exostosis without surrounding mass effect or edema. Sinuses/Orbits: There is mucosal thickening involving several ethmoid air cells bilaterally. Visualized paranasal sinuses elsewhere are clear. Orbits appear symmetric bilaterally. Other: Mastoid air cells are clear. IMPRESSION: 1. Posterior fossa extra-axial cyst, a benign finding with chronic remodeling in the adjacent cerebellar hemispheres. No other evident mass. No hemorrhage. No acute infarct. 2. Small right frontal bony exostosis, not felt to have clinical significance. 3.  Mild mucosal thickening in several ethmoid air cells. Electronically Signed   By: Lowella Grip III M.D.   On: 07/18/2018 09:12   Mr Brain W And Wo Contrast  Result Date: 07/24/2018 CLINICAL DATA:  Nausea and vomiting.  Headache. EXAM: MRI HEAD WITHOUT AND WITH CONTRAST TECHNIQUE: Multiplanar, multiecho pulse sequences of the brain and surrounding structures were obtained without and with intravenous contrast. CONTRAST:  10 cc Gadavist COMPARISON:  head CT 06/17/2018. FINDINGS: The study suffers from motion degradation but is sufficiently diagnostic. Brain: The brain has a normal appearance without evidence of malformation, atrophy, old or acute small or large vessel infarction, mass lesion, hemorrhage, hydrocephalus or extra-axial collection. Incidental and insignificant mega cisterna magna. No abnormal contrast  enhancement occurs. Vascular: Major vessels at the base of the brain show flow. Venous sinuses appear patent. Skull and upper cervical spine: Normal. Sinuses/Orbits: Clear/normal. Other: None significant. IMPRESSION: Normal examination. No cause of the presenting symptoms is identified. 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 Electronically Signed   By: Nelson Chimes M.D.   On: 07/24/2018 12:04   Dg Abd Acute W/chest  Result Date: 07/24/2018 CLINICAL DATA:  Abdominal pain and vomiting EXAM: DG ABDOMEN ACUTE W/ 1V CHEST COMPARISON:  KUB 07/18/2018 FINDINGS: There is no evidence of dilated bowel loops or free intraperitoneal air. Surgical clips right upper quadrant. No radiopaque calculi or other significant radiographic abnormality is seen. Heart size and mediastinal contours are within normal limits. Both lungs are clear. IMPRESSION: Negative abdominal radiographs.  No acute cardiopulmonary disease. Electronically Signed   By: Franchot Gallo M.D.   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  Code Status full  Disposition Plan: Home  Time spent in minutes : 34 minutes  Condition GUARDED   @SIGNATURE @

## 2018-07-24 NOTE — ED Triage Notes (Signed)
The pt  Is c/o a headache nausea vomiting for 2 weeks/  He has seen his regular doctor and he has been here numerus times. The last time he was here was nov 5th

## 2018-07-24 NOTE — ED Notes (Signed)
Patient remains in MRI 

## 2018-07-24 NOTE — ED Notes (Signed)
Iv med given

## 2018-07-24 NOTE — ED Provider Notes (Signed)
[place] EMERGENCY DEPARTMENT Provider Note   CSN: [id number] Arrival date & time: 07/24/18  0609     History   Chief Complaint No chief complaint on file.   HPI Steven Black is a 42 y.o. male who presents emergency department with chief complaint of abdominal pain and vomiting.  Since 07/17/2018 the patient has been seen 3 times.  He has a history of cyclic nausea vomiting and marijuana abuse.  He denies any current marijuana abuse and states that he stopped smoking when he was told it could be contributing to this symptom however his urine was positive straight for marijuana on 07/18/2018.  He complains of pain in his epigastrium.  He has had multiple and intractable episodes of nonbloody nonbilious vomitus.  The patient states that he was feeling better when he was discharged on the 5th, but continued to have some nausea that was manageable with his home medications.  However around 12:00 last night he had intractable vomiting.  He complains of epigastric pain, headache, and feeling very dehydrated.  He is status post cholecystectomy.  HPI  Past Medical History:  Diagnosis Date  . Emesis     There are no active problems to display for this patient.   Past Surgical History:  Procedure Laterality Date  . CHOLECYSTECTOMY          Home Medications    Prior to Admission medications   Medication Sig Start Date End Date Taking? Authorizing Provider  famotidine (PEPCID) 20 MG tablet Take 1 tablet (20 mg total) by mouth 2 (two) times daily. Patient not taking: Reported on 07/22/2018 07/17/18   McDonald, Mia A, PA-C  LORazepam (ATIVAN) 1 MG tablet Take 1 tablet (1 mg total) by mouth 3 (three) times daily as needed for anxiety (or vertigo). 40/9/81   Delora Fuel, MD  meclizine (ANTIVERT) 25 MG tablet Take 1 tablet (25 mg total) by mouth 3 (three) times daily as needed for dizziness. 19/1/47   Delora Fuel, MD  metoCLOPramide (REGLAN) 10 MG tablet Take 1 tablet  (10 mg total) by mouth every 6 (six) hours as needed for nausea (or headache). 82/9/56   Delora Fuel, MD  promethazine (PHENERGAN) 25 MG tablet Take 1 tablet (25 mg total) by mouth every 6 (six) hours as needed for nausea or vomiting. 07/18/18   Quintella Reichert, MD    Family History No family history on file.  Social History Social History   Tobacco Use  . Smoking status: Current Every Day Smoker    Packs/day: 0.50    Types: Cigarettes  . Smokeless tobacco: Never Used  . Tobacco comment: vape with nicotine  Substance Use Topics  . Alcohol use: No  . Drug use: Yes    Types: Marijuana    Comment: last use " a couple" of months     Allergies   Patient has no known allergies.   Review of Systems Review of Systems  Ten systems reviewed and are negative for acute change, except as noted in the HPI.   Physical Exam Updated Vital Signs There were no vitals taken for this visit.  Physical Exam  Constitutional: He is oriented to person, place, and time. He appears well-developed and well-nourished. No distress.  Patient appears uncomfortable  HENT:  Head: Normocephalic and atraumatic.  Dry oral mucosa  Eyes: Pupils are equal, round, and reactive to light. Conjunctivae and EOM are normal. No scleral icterus.  Neck: Normal range of motion. Neck supple.  Cardiovascular: Normal rate,  regular rhythm and normal heart sounds.  Pulmonary/Chest: Effort normal and breath sounds normal. No respiratory distress.  Abdominal: Soft. Bowel sounds are normal. There is tenderness. There is guarding.  Diffuse tenderness and guarding no rebound.  Musculoskeletal: Normal range of motion. He exhibits no edema.  Neurological: He is alert and oriented to person, place, and time.  Skin: Skin is warm and dry. He is not diaphoretic.  Psychiatric: His behavior is normal.  Nursing note and vitals reviewed.    ED Treatments / Results  Labs (all labs ordered are listed, but only abnormal results  are displayed) Labs Reviewed - No data to display  EKG None  Radiology No results found.  Procedures Procedures (including critical care time)  Medications Ordered in ED Medications - No data to display   Initial Impression / Assessment and Plan / ED Course  I have reviewed the triage vital signs and the nursing notes.  Pertinent labs & imaging results that were available during my care of the patient were reviewed by me and considered in my medical decision making (see chart for details).  Clinical Course as of Jul 24 1653  Thu Jul 24, 2018  0743 BP(!): 149/99 [AH]  1112 42 year old male with epigastric pain and vomiting. The emergent differential diagnosis for vomiting includes, but is not limited to ACS/MI, Boerhaave's, cannabis hyperemesis, DKA, Intracranial Hemorrhage, Ischemic bowel, Meningitis, Sepsis, Acute radiation syndrome, Acute gastric dilation, Acetaminophen toxicity, Adrenal insufficiency, Appendicitis, Aspirin toxicity, Bowel obstruction/ileus, Carbon monoxide poisoning, Cholecystitis, CNS tumor. Digoxin toxicity, Electrolyte abnormalities, Elevated ICP, Gastric outlet obstruction, Pancreatitis, Peritonitis, Ruptured viscus, Testicular torsion/ovarian torsion, Theophyline toxicity, Biliary colic, Cannabinoid hyperemesis syndrome, Chemotherapy, Disulfiram effect, Erythromycin, ETOH, Gastritis, Gastroenteritis, Gastroparesis, Hepatitis, Ibuprofen, Ipecac toxicity, Labyrinthitis, Migraine, Motion sickness, Narcotic withdrawal, Thyroid, Pregnancy, Peptic ulcer disease, Renal colic, and UTI    [AH]  2458 Patient vomiting and retching again. Unable to hold down fluids. His wife is at bedside and concerned about a cyst in his brain as the potential cause of his intractable vomiting. I will consult with the Neurology.   [AH]  1005 I spoke with Dr. Rory Percy who asks that we get an MRI brain with without to make sure that there is not a central cause such as MS.  And to make sure  that the cyst is not causing any pressure on the brainstem.   [AH]    Clinical Course User Index [AH] Margarita Mail, PA-C    Patient with intractable vomiting, MRI shows the cyst in question without evidence of MS or brainstem compression.  I discussed the results with the patient.  He will be admitted to the hospitalist service by Dr. Laren Everts.  Final Clinical Impressions(s) / ED Diagnoses   Final diagnoses:  Intractable vomiting with nausea    ED Discharge Orders    None       Margarita Mail, PA-C 07/24/18 1655    Pattricia Boss, MD 07/26/18 1536

## 2018-07-24 NOTE — ED Notes (Signed)
Patient transported to MRI 

## 2018-07-25 ENCOUNTER — Encounter (HOSPITAL_COMMUNITY): Payer: Self-pay | Admitting: Internal Medicine

## 2018-07-25 DIAGNOSIS — Q049 Congenital malformation of brain, unspecified: Secondary | ICD-10-CM

## 2018-07-25 DIAGNOSIS — F12188 Cannabis abuse with other cannabis-induced disorder: Secondary | ICD-10-CM

## 2018-07-25 LAB — BASIC METABOLIC PANEL
Anion gap: 5 (ref 5–15)
BUN: 6 mg/dL (ref 6–20)
CALCIUM: 8.5 mg/dL — AB (ref 8.9–10.3)
CHLORIDE: 108 mmol/L (ref 98–111)
CO2: 22 mmol/L (ref 22–32)
CREATININE: 0.92 mg/dL (ref 0.61–1.24)
GFR calc non Af Amer: >60 mL/min (ref >60)
Glucose, Bld: 95 mg/dL (ref 70–99)
Potassium: 3.6 mmol/L (ref 3.5–5.1)
SODIUM: 135 mmol/L (ref 135–145)

## 2018-07-25 LAB — HIV ANTIBODY (ROUTINE TESTING W REFLEX): HIV Screen 4th Generation wRfx: NONREACTIVE

## 2018-07-25 MED ORDER — SENNOSIDES-DOCUSATE SODIUM 8.6-50 MG PO TABS: 1.0000 | ORAL_TABLET | Freq: Two times a day (BID) | ORAL | 1 refills | Status: AC

## 2018-07-25 MED ORDER — SENNOSIDES-DOCUSATE SODIUM 8.6-50 MG PO TABS
1.0000 | ORAL_TABLET | Freq: Two times a day (BID) | ORAL | Status: DC
  Administered 2018-07-25: 1 via ORAL
  Filled 2018-07-25: qty 1

## 2018-07-25 NOTE — Progress Notes (Signed)
Pt says hasn't had a good bowel movement for weeks.  Last night had an explosive diarrhea and stool.  Feels so much better now.  Felt like he had some sort of a blockage.  Now feels so much better.  Now not dizzy or nauseous.

## 2018-07-25 NOTE — Discharge Summary (Signed)
Physician Discharge Summary  Steven Black WJX:[id number] DOB: 03/24/1976 DOA: 07/24/2018  PCP: London Pepper, MD  Admit date: 07/24/2018 Discharge date: 07/25/2018  Time spent: 45 minutes  Recommendations for Outpatient Follow-up:  1. Follow up with PCP 1-2 weeks for evaluation of  Cyclical nausea and vomiting and management of constipation 2. Follow up with Gastroenterology as scheduled 3. 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 He was provided with Haldol and improved  Hospital Course:   Cannabis hyperemesis syndrome with cyclical nausea and vomiting            Status post Haldol and capsaicin in the emergency room -much improved this am -reported very large BM this am and tolerating full liquids at discharge.  -has appointment with gastroenterology next week -recommend colace bid for 2 weeks then possibly daily; defer management of constipation to gi  Mega cisterna magna       Incidental finding by MRI of the brain. Per chart dr Malen Gauze with neuro felt insignificant       OP follow up  Anxiety       stable at discharge  Procedures:    Consultations:  Dr Malen Gauze  Discharge Exam: Vitals:   07/25/18 0513 07/25/18 1148  BP: 127/84 (!) 99/56  Pulse: (!) 56 (!) 56  Resp: 16   Temp: 99 F (37.2 C) 97.8 F (36.6 C)  SpO2: 97% 99%    General: well nourished no acute distress Cardiovascular: rrr no mgr no LE edema Respiratory: normal effort BS clear bilaterally Abdomen: non-distended +BS no guarding or  reblounding  Discharge Instructions   Discharge Instructions    Call MD for:  persistant dizziness or light-headedness   Complete by:  As directed    Call MD for:  temperature >100.4   Complete by:  As directed    Diet - low sodium heart healthy   Complete by:  As directed    Discharge instructions   Complete by:  As directed    Follow up with PCP 1-2 weeks for evaluation of nausea/vomiting and nutritional status Recommend colace twice daily for 14 days then daily to avoid constipation Recommend hot shower for episodes of nausea   Increase activity slowly   Complete by:  As directed      Allergies as of 07/25/2018   No Known Allergies     Medication List    STOP taking these medications   famotidine 20 MG tablet Commonly known as:  PEPCID     TAKE these medications   LORazepam 1 MG tablet Commonly known as:  ATIVAN Take 1 tablet (1 mg total) by mouth 3 (three) times daily as needed for anxiety (or vertigo).   meclizine 25 MG tablet Commonly known as:  ANTIVERT Take 1 tablet (25 mg total) by mouth 3 (three) times daily as needed for dizziness.   metoCLOPramide 10 MG tablet Commonly known as:  REGLAN Take 1 tablet (10 mg total) by mouth every 6 (six) hours as needed for nausea (or headache).   promethazine 25 MG tablet Commonly  known as:  PHENERGAN Take 1 tablet (25 mg total) by mouth every 6 (six) hours as needed for nausea or vomiting.   senna-docusate 8.6-50 MG tablet Commonly known as:  Senokot-S Take 1 tablet by mouth 2 (two) times daily.      No Known Allergies    The results of significant diagnostics from this hospitalization (including imaging, microbiology, ancillary and laboratory) are listed below for reference.    Significant Diagnostic Studies: Dg Abdomen 1 View  Result Date: 07/18/2018 CLINICAL DATA:  Nausea, vomiting, abdominal pain EXAM: ABDOMEN - 1 VIEW COMPARISON:  11/27/2017 FINDINGS: Prior cholecystectomy. Calcified phleboliths in the  lower pelvis, stable. There is a non obstructive bowel gas pattern. No supine evidence of free air. No organomegaly or suspicious calcification. No acute bony abnormality. IMPRESSION: No acute findings. Electronically Signed   By: Rolm Baptise M.D.   On: 07/18/2018 10:16   Ct Head Wo Contrast  Result Date: 07/18/2018 CLINICAL DATA:  Headache with nausea and vomiting EXAM: CT HEAD WITHOUT CONTRAST TECHNIQUE: Contiguous axial images were obtained from the base of the skull through the vertex without intravenous contrast. COMPARISON:  None. FINDINGS: Brain: The ventricles are normal in size and configuration. 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 There is no other evident mass. There is no intra-axial mass or hemorrhage. There is no subdural or epidural fluid collection. No midline shift. Brain parenchyma appears unremarkable. No evident acute infarct. Right temporal horn of the lateral ventricle is slightly larger than its counterpart on the left, a presumed anatomic variant. Vascular: No hyperdense vessel.  No evident vascular calcification. Skull: Bony calvarium appears intact. There is a right frontal exostosis without surrounding mass effect or edema. Sinuses/Orbits: There is mucosal thickening involving several ethmoid air cells bilaterally. Visualized paranasal sinuses elsewhere are clear. Orbits appear symmetric bilaterally. Other: Mastoid air cells are clear. IMPRESSION: 1. Posterior fossa extra-axial cyst, a benign finding with chronic remodeling in the adjacent cerebellar hemispheres. No other evident mass. No hemorrhage. No acute infarct. 2. Small right frontal bony exostosis, not felt to have clinical significance. 3.  Mild mucosal thickening in several ethmoid air cells. Electronically Signed   By: Lowella Grip III M.D.   On: 07/18/2018 09:12   Mr Brain W And Wo Contrast  Result Date: 07/24/2018 CLINICAL DATA:  Nausea and vomiting.  Headache.  EXAM: MRI HEAD WITHOUT AND WITH CONTRAST TECHNIQUE: Multiplanar, multiecho pulse sequences of the brain and surrounding structures were obtained without and with intravenous contrast. CONTRAST:  10 cc Gadavist COMPARISON:  head CT 06/17/2018. FINDINGS: The study suffers from motion degradation but is sufficiently diagnostic. Brain: The brain has a normal appearance without evidence of malformation, atrophy, old or acute small or large vessel infarction, mass lesion, hemorrhage, hydrocephalus or extra-axial collection. Incidental and insignificant mega cisterna magna. No abnormal contrast enhancement occurs. Vascular: Major vessels at the base of the brain show flow. Venous sinuses appear patent. Skull and upper cervical spine: Normal. Sinuses/Orbits: Clear/normal. Other: None significant. IMPRESSION: Normal examination. No cause of the presenting symptoms is identified. 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 Electronically Signed   By: Nelson Chimes M.D.   On: 07/24/2018 12:04   Dg Abd Acute W/chest  Result Date: 07/24/2018 CLINICAL DATA:  Abdominal pain and vomiting EXAM: DG ABDOMEN ACUTE W/ 1V CHEST COMPARISON:  KUB 07/18/2018 FINDINGS: There is no evidence of dilated bowel loops or  free intraperitoneal air. Surgical clips right upper quadrant. No radiopaque calculi or other significant radiographic abnormality is seen. Heart size and mediastinal contours are within normal limits. Both lungs are clear. IMPRESSION: Negative abdominal radiographs.  No acute cardiopulmonary disease. Electronically Signed   By: Franchot Gallo M.D.   On: 07/24/2018 07:52    Microbiology: No results found for this or any previous visit (from the past 240 hour(s)).   Labs: Basic Metabolic Panel: Recent Labs  Lab 07/19/18 2353 07/22/18 1001 07/24/18 0628 07/25/18 0504  NA 135 138 134* 135  K 3.2* 3.6 3.2* 3.6  CL 105 109 103 108  CO2 20* 16* 21* 22  GLUCOSE 146*  121* 118* 95  BUN 10 9 7 6   CREATININE 1.03 1.02 1.03 0.92  CALCIUM 9.2 9.4 9.5 8.5*   Liver Function Tests: Recent Labs  Lab 07/22/18 1001 07/24/18 0628  AST 28 30  ALT 37 39  ALKPHOS 49 41  BILITOT 0.8 1.0  PROT 7.0 6.7  ALBUMIN 4.4 4.3   Recent Labs  Lab 07/22/18 1001 07/24/18 0628  LIPASE 43 37   No results for input(s): AMMONIA in the last 168 hours. CBC: Recent Labs  Lab 07/19/18 2353 07/22/18 1001 07/24/18 0628  WBC 14.1* 16.9* 12.8*  HGB 17.9* 17.3* 17.5*  HCT 51.9 52.7* 52.4*  MCV 87.7 91.7 88.7  PLT 198 236 262   Cardiac Enzymes: No results for input(s): CKTOTAL, CKMB, CKMBINDEX, TROPONINI in the last 168 hours. BNP: BNP (last 3 results) No results for input(s): BNP in the last 8760 hours.  ProBNP (last 3 results) No results for input(s): PROBNP in the last 8760 hours.  CBG: No results for input(s): GLUCAP in the last 168 hours.     SignedRadene Gunning NP [facility] 07/25/2018, 1:06 PM

## 2018-07-27 ENCOUNTER — Other Ambulatory Visit: Payer: Self-pay

## 2018-07-27 ENCOUNTER — Emergency Department (HOSPITAL_COMMUNITY)
Admission: EM | Admit: 2018-07-27 | Discharge: 2018-07-27 | Disposition: A | Discharge status: Home / Self Care | Payer: 59 | Attending: Emergency Medicine | Admitting: Emergency Medicine

## 2018-07-27 DIAGNOSIS — G43A0 Cyclical vomiting, not intractable: Secondary | ICD-10-CM | POA: Diagnosis not present

## 2018-07-27 DIAGNOSIS — Z79899 Other long term (current) drug therapy: Secondary | ICD-10-CM | POA: Diagnosis not present

## 2018-07-27 DIAGNOSIS — F17210 Nicotine dependence, cigarettes, uncomplicated: Secondary | ICD-10-CM | POA: Diagnosis not present

## 2018-07-27 DIAGNOSIS — R1115 Cyclical vomiting syndrome unrelated to migraine: Secondary | ICD-10-CM | POA: Insufficient documentation

## 2018-07-27 DIAGNOSIS — R112 Nausea with vomiting, unspecified: Secondary | ICD-10-CM | POA: Diagnosis present

## 2018-07-27 LAB — CBC WITH DIFFERENTIAL/PLATELET
Abs Immature Granulocytes: 0.11 10*3/uL — ABNORMAL HIGH (ref 0.00–0.07)
Basophils Absolute: 0.1 10*3/uL (ref 0.0–0.1)
Basophils Relative: 0 %
EOS ABS: 0.2 10*3/uL (ref 0.0–0.5)
EOS PCT: 1 %
HEMATOCRIT: 51 % (ref 39.0–52.0)
HEMOGLOBIN: 17 g/dL (ref 13.0–17.0)
Immature Granulocytes: 1 %
LYMPHS PCT: 12 %
Lymphs Abs: 1.8 10*3/uL (ref 0.7–4.0)
MCH: 30.6 pg (ref 26.0–34.0)
MCHC: 33.3 g/dL (ref 30.0–36.0)
MCV: 91.7 fL (ref 80.0–100.0)
MONO ABS: 1.2 10*3/uL — AB (ref 0.1–1.0)
Monocytes Relative: 8 %
NRBC: 0 % (ref 0.0–0.2)
Neutro Abs: 11.3 10*3/uL — ABNORMAL HIGH (ref 1.7–7.7)
Neutrophils Relative %: 78 %
PLATELETS: 245 10*3/uL (ref 150–400)
RBC: 5.56 MIL/uL (ref 4.22–5.81)
RDW: 13.3 % (ref 11.5–15.5)
WBC: 14.6 10*3/uL — AB (ref 4.0–10.5)

## 2018-07-27 LAB — URINALYSIS, ROUTINE W REFLEX MICROSCOPIC
BILIRUBIN URINE: NEGATIVE
Glucose, UA: NEGATIVE mg/dL
HGB URINE DIPSTICK: NEGATIVE
Ketones, ur: NEGATIVE mg/dL
Leukocytes, UA: NEGATIVE
NITRITE: NEGATIVE
PROTEIN: NEGATIVE mg/dL
Specific Gravity, Urine: 1.014 (ref 1.005–1.030)
pH: 9 — ABNORMAL HIGH (ref 5.0–8.0)

## 2018-07-27 LAB — COMPREHENSIVE METABOLIC PANEL
ALBUMIN: 4.2 g/dL (ref 3.5–5.0)
ALT: 50 U/L — ABNORMAL HIGH (ref 0–44)
ANION GAP: 9 (ref 5–15)
AST: 40 U/L (ref 15–41)
Alkaline Phosphatase: 47 U/L (ref 38–126)
BUN: 9 mg/dL (ref 6–20)
CHLORIDE: 105 mmol/L (ref 98–111)
CO2: 25 mmol/L (ref 22–32)
Calcium: 9.8 mg/dL (ref 8.9–10.3)
Creatinine, Ser: 1.04 mg/dL (ref 0.61–1.24)
GFR calc Af Amer: >60 mL/min (ref >60)
GFR calc non Af Amer: >60 mL/min (ref >60)
GLUCOSE: 128 mg/dL — AB (ref 70–99)
POTASSIUM: 4.1 mmol/L (ref 3.5–5.1)
Sodium: 139 mmol/L (ref 135–145)
TOTAL PROTEIN: 6.9 g/dL (ref 6.5–8.1)
Total Bilirubin: 0.7 mg/dL (ref 0.3–1.2)

## 2018-07-27 LAB — RAPID URINE DRUG SCREEN, HOSP PERFORMED
Amphetamines: NOT DETECTED
Barbiturates: NOT DETECTED
Benzodiazepines: NOT DETECTED
Cocaine: NOT DETECTED
OPIATES: NOT DETECTED
TETRAHYDROCANNABINOL: POSITIVE — AB

## 2018-07-27 LAB — LIPASE, BLOOD: Lipase: 38 U/L (ref 11–51)

## 2018-07-27 MED ORDER — ONDANSETRON HCL 4 MG/2ML IJ SOLN
4.0000 mg | Freq: Once | INTRAMUSCULAR | Status: AC
  Administered 2018-07-27: 4 mg via INTRAVENOUS
  Filled 2018-07-27: qty 2

## 2018-07-27 MED ORDER — MORPHINE SULFATE (PF) 4 MG/ML IV SOLN
4.0000 mg | Freq: Once | INTRAVENOUS | Status: AC
  Administered 2018-07-27: 4 mg via INTRAVENOUS
  Filled 2018-07-27: qty 1

## 2018-07-27 MED ORDER — HALOPERIDOL LACTATE 5 MG/ML IJ SOLN
5.0000 mg | Freq: Once | INTRAMUSCULAR | Status: AC
  Administered 2018-07-27: 5 mg via INTRAVENOUS
  Filled 2018-07-27: qty 1

## 2018-07-27 MED ORDER — SODIUM CHLORIDE 0.9 % IV BOLUS
1000.0000 mL | Freq: Once | INTRAVENOUS | Status: AC
  Administered 2018-07-27: 1000 mL via INTRAVENOUS

## 2018-07-27 NOTE — ED Provider Notes (Signed)
[place] EMERGENCY DEPARTMENT Provider Note   CSN: [id number] Arrival date & time: 07/27/18  4627     History   Chief Complaint Chief Complaint  Patient presents with  . Emesis    HPI Caylan Schifano is a 42 y.o. male.  The history is provided by the patient and medical records. No language interpreter was used.  Emesis       42 year old male with history of cannabinoid hyperemesis syndrome, prior cholecystectomy presenting complaint of abdominal pain.  Patient endorsed diffuse abdominal pain ongoing for the past 3 weeks with persistent nausea and vomiting.  Vomitus has been persistent and worse in the past few days.  He has vomited at least 5 times morning and also noticed blood in his emesis.  Described as trace of blood in his emesis which has since resolved.  Abdominal pain is diffuse, sharp, cramping, 10 out of 10.  Endorse some mild chills without fever.  Last bowel movement was last night.  He is unable to eat food but states he can drink water.  He denies any recent alcohol or drug use, states last marijuana use was 2 weeks ago.  No report of chest pain, shortness of breath, leg swelling, having diarrhea.  Patient reports he has had recurrent abdominal pain since August and has been evaluated in the ED multiple times for this.  States that he had an abdominal pelvic CT scan several days prior that was unremarkable.  He is recommended to follow-up with a GI specialist in the near future.        Past Medical History:  Diagnosis Date  . Cannabis hyperemesis syndrome concurrent with and due to cannabis abuse ([place])   . Emesis   . Mega cisterna magna ([place])    07/24/18 incidental finding    Patient Active Problem List   Diagnosis Date Noted  . Mega cisterna magna ([place])   . Cannabis hyperemesis syndrome concurrent with and due to cannabis abuse ([place]) 07/24/2018    Past Surgical History:  Procedure Laterality Date  . CHOLECYSTECTOMY           Home Medications    Prior to Admission medications   Medication Sig Start Date End Date Taking? Authorizing Provider  ondansetron (ZOFRAN-ODT) 4 MG disintegrating tablet Take 4 mg by mouth every 8 (eight) hours as needed for nausea or vomiting.   Yes [provider]  potassium chloride SA (K-DUR,KLOR-CON) 20 MEQ tablet Take 20 mEq by mouth daily.   Yes [provider]  LORazepam (ATIVAN) 1 MG tablet Take 1 tablet (1 mg total) by mouth 3 (three) times daily as needed for anxiety (or vertigo). 11/20/98   Delora Fuel, MD  meclizine (ANTIVERT) 25 MG tablet Take 1 tablet (25 mg total) by mouth 3 (three) times daily as needed for dizziness. 93/8/18   Delora Fuel, MD  metoCLOPramide (REGLAN) 10 MG tablet Take 1 tablet (10 mg total) by mouth every 6 (six) hours as needed for nausea (or headache). 29/9/37   Delora Fuel, MD  promethazine (PHENERGAN) 25 MG tablet Take 1 tablet (25 mg total) by mouth every 6 (six) hours as needed for nausea or vomiting. 07/18/18   Quintella Reichert, MD  senna-docusate (SENOKOT-S) 8.6-50 MG tablet Take 1 tablet by mouth 2 (two) times daily. 07/25/18   Black, Lezlie Octave, NP    Family History No family history on file.  Social History Social History   Tobacco Use  . Smoking status: Current Every Day  Smoker    Packs/day: 0.50    Types: Cigarettes  . Smokeless tobacco: Never Used  . Tobacco comment: vape with nicotine  Substance Use Topics  . Alcohol use: No  . Drug use: Yes    Types: Marijuana    Comment: last use " a couple" of months     Allergies   Patient has no known allergies.   Review of Systems Review of Systems  Gastrointestinal: Positive for vomiting.  All other systems reviewed and are negative.    Physical Exam Updated Vital Signs BP (!) 148/91   Pulse 70   Temp 98.8 F (37.1 C) (Oral)   Resp 17   Wt 94 kg   SpO2 100%   BMI 23.35 kg/m   Physical Exam  Constitutional: He is oriented to person, place, and  time. He appears well-developed and well-nourished. No distress.  HENT:  Head: Atraumatic.  Poor dentition  Eyes: Conjunctivae are normal.  Neck: Neck supple.  Cardiovascular: Normal rate and regular rhythm.  Pulmonary/Chest: Effort normal and breath sounds normal.  Abdominal: Soft. Bowel sounds are normal. He exhibits no distension. There is tenderness (Diffuse abdominal tenderness without guarding or rebound tenderness.  Bowel sounds present).  Neurological: He is alert and oriented to person, place, and time.  Skin: No rash noted.  Psychiatric: He has a normal mood and affect.  Nursing note and vitals reviewed.    ED Treatments / Results  Labs (all labs ordered are listed, but only abnormal results are displayed) Labs Reviewed  CBC WITH DIFFERENTIAL/PLATELET - Abnormal; Notable for the following components:      Result Value   WBC 14.6 (*)    Neutro Abs 11.3 (*)    Monocytes Absolute 1.2 (*)    Abs Immature Granulocytes 0.11 (*)    All other components within normal limits  COMPREHENSIVE METABOLIC PANEL - Abnormal; Notable for the following components:   Glucose, Bld 128 (*)    ALT 50 (*)    All other components within normal limits  URINALYSIS, ROUTINE W REFLEX MICROSCOPIC - Abnormal; Notable for the following components:   pH 9.0 (*)    All other components within normal limits  RAPID URINE DRUG SCREEN, HOSP PERFORMED - Abnormal; Notable for the following components:   Tetrahydrocannabinol POSITIVE (*)    All other components within normal limits  LIPASE, BLOOD    EKG None  Radiology No results found.  Procedures Procedures (including critical care time)  Medications Ordered in ED Medications  sodium chloride 0.9 % bolus 1,000 mL (0 mLs Intravenous Stopped 07/27/18 0932)  haloperidol lactate (HALDOL) injection 5 mg (5 mg Intravenous Given 07/27/18 0812)  ondansetron (ZOFRAN) injection 4 mg (4 mg Intravenous Given 07/27/18 0812)  ondansetron (ZOFRAN)  injection 4 mg (4 mg Intravenous Given 07/27/18 0904)  morphine 4 MG/ML injection 4 mg (4 mg Intravenous Given 07/27/18 0904)     Initial Impression / Assessment and Plan / ED Course  I have reviewed the triage vital signs and the nursing notes.  Pertinent labs & imaging results that were available during my care of the patient were reviewed by me and considered in my medical decision making (see chart for details).     BP (!) 144/90 (BP Location: Right Arm)   Pulse 70   Temp 98.8 F (37.1 C) (Oral)   Resp 17   Wt 94 kg   SpO2 100%   BMI 23.35 kg/m    Final Clinical Impressions(s) / ED Diagnoses  Final diagnoses:  Cyclical vomiting    ED Discharge Orders    None     7:41 AM Patient here with diffuse abdominal discomfort with nausea and vomiting.  Now vomiting trace of blood.  History of cannabinoid hyperemesis syndrome which is likely the etiology of his current complaint.  Work-up initiated, will provide symptomatic treatment.  9:38 AM Labs remarkable for positive THC.  Urine without signs of urinary tract infection and no ketone.  Mild elevated WBC of 14.6, likely stress demargination, electrolyte panels are reassuring, normal lipase.  At this time, patient is resting comfortably, sleeping, in no acute discomfort.  Vital signs stable.  10:12 AM I encourage pt to avoid marijuana use and to f/u with GI specialist as previously scheduled.  Wife of pt also request neurology f/u for further evauation of incidental mega cistern magna found on recent head ct scan. Referral given.  Return precaution discussed.     Domenic Moras, PA-C 07/27/18 [place], MD 07/27/18 1626

## 2018-07-27 NOTE — Discharge Instructions (Addendum)
Please follow up with your GI specialist as previously scheduled.  Also discussed with your primary care provider if following up with a neurologist may be helpful in your current condition. Return if you have any concerns. Avoid marijuana use as it may worsen your symptoms.

## 2018-07-27 NOTE — ED Triage Notes (Addendum)
Patient c/o cyclical vomiting x3 weeks. Denies substance abuse. Last BM was yesterday. Has been seen here multiple times for same.

## 2018-07-30 DIAGNOSIS — R194 Change in bowel habit: Secondary | ICD-10-CM | POA: Diagnosis not present

## 2018-07-30 DIAGNOSIS — R1084 Generalized abdominal pain: Secondary | ICD-10-CM | POA: Diagnosis not present

## 2018-07-30 DIAGNOSIS — R112 Nausea with vomiting, unspecified: Secondary | ICD-10-CM | POA: Diagnosis not present

## 2018-08-13 DIAGNOSIS — R195 Other fecal abnormalities: Secondary | ICD-10-CM | POA: Diagnosis not present

## 2018-08-13 DIAGNOSIS — D125 Benign neoplasm of sigmoid colon: Secondary | ICD-10-CM | POA: Diagnosis not present

## 2018-08-13 DIAGNOSIS — K210 Gastro-esophageal reflux disease with esophagitis: Secondary | ICD-10-CM | POA: Diagnosis not present

## 2018-10-31 ENCOUNTER — Encounter (HOSPITAL_COMMUNITY): Payer: Self-pay

## 2018-10-31 ENCOUNTER — Emergency Department (HOSPITAL_COMMUNITY)
Admission: EM | Admit: 2018-10-31 | Discharge: 2018-10-31 | Disposition: A | Discharge status: Home / Self Care | Payer: 59 | Attending: Emergency Medicine | Admitting: Emergency Medicine

## 2018-10-31 ENCOUNTER — Other Ambulatory Visit: Payer: Self-pay

## 2018-10-31 DIAGNOSIS — R1110 Vomiting, unspecified: Secondary | ICD-10-CM | POA: Diagnosis present

## 2018-10-31 DIAGNOSIS — R1115 Cyclical vomiting syndrome unrelated to migraine: Secondary | ICD-10-CM | POA: Insufficient documentation

## 2018-10-31 DIAGNOSIS — F17210 Nicotine dependence, cigarettes, uncomplicated: Secondary | ICD-10-CM | POA: Diagnosis not present

## 2018-10-31 LAB — CBC WITH DIFFERENTIAL/PLATELET
Abs Immature Granulocytes: 0.16 10*3/uL — ABNORMAL HIGH (ref 0.00–0.07)
Basophils Absolute: 0.1 10*3/uL (ref 0.0–0.1)
Basophils Relative: 1 %
Eosinophils Absolute: 0.1 10*3/uL (ref 0.0–0.5)
Eosinophils Relative: 0 %
HCT: 54 % — ABNORMAL HIGH (ref 39.0–52.0)
Hemoglobin: 18 g/dL — ABNORMAL HIGH (ref 13.0–17.0)
Immature Granulocytes: 1 %
Lymphocytes Relative: 10 %
Lymphs Abs: 1.8 10*3/uL (ref 0.7–4.0)
MCH: 30.1 pg (ref 26.0–34.0)
MCHC: 33.3 g/dL (ref 30.0–36.0)
MCV: 90.2 fL (ref 80.0–100.0)
Monocytes Absolute: 1.1 10*3/uL — ABNORMAL HIGH (ref 0.1–1.0)
Monocytes Relative: 6 %
Neutro Abs: 16.1 10*3/uL — ABNORMAL HIGH (ref 1.7–7.7)
Neutrophils Relative %: 82 %
Platelets: 209 10*3/uL (ref 150–400)
RBC: 5.99 MIL/uL — ABNORMAL HIGH (ref 4.22–5.81)
RDW: 13.2 % (ref 11.5–15.5)
WBC: 19.3 10*3/uL — ABNORMAL HIGH (ref 4.0–10.5)
nRBC: 0 % (ref 0.0–0.2)

## 2018-10-31 LAB — URINALYSIS, ROUTINE W REFLEX MICROSCOPIC
Bilirubin Urine: NEGATIVE
Glucose, UA: NEGATIVE mg/dL
Hgb urine dipstick: NEGATIVE
Ketones, ur: 80 mg/dL — AB
Leukocytes,Ua: NEGATIVE
Nitrite: NEGATIVE
Protein, ur: NEGATIVE mg/dL
Specific Gravity, Urine: 1.026 (ref 1.005–1.030)
pH: 5 (ref 5.0–8.0)

## 2018-10-31 LAB — COMPREHENSIVE METABOLIC PANEL
ALT: 44 U/L (ref 0–44)
AST: 31 U/L (ref 15–41)
Albumin: 4.7 g/dL (ref 3.5–5.0)
Alkaline Phosphatase: 56 U/L (ref 38–126)
Anion gap: 17 — ABNORMAL HIGH (ref 5–15)
BUN: 13 mg/dL (ref 6–20)
CO2: 15 mmol/L — ABNORMAL LOW (ref 22–32)
Calcium: 10.2 mg/dL (ref 8.9–10.3)
Chloride: 108 mmol/L (ref 98–111)
Creatinine, Ser: 1.24 mg/dL (ref 0.61–1.24)
GFR calc Af Amer: >60 mL/min (ref >60)
GFR calc non Af Amer: >60 mL/min (ref >60)
Glucose, Bld: 163 mg/dL — ABNORMAL HIGH (ref 70–99)
Potassium: 3.8 mmol/L (ref 3.5–5.1)
Sodium: 140 mmol/L (ref 135–145)
Total Bilirubin: 1.2 mg/dL (ref 0.3–1.2)
Total Protein: 7.7 g/dL (ref 6.5–8.1)

## 2018-10-31 MED ORDER — SODIUM CHLORIDE 0.9 % IV BOLUS
1000.0000 mL | Freq: Once | INTRAVENOUS | Status: AC
  Administered 2018-10-31: 1000 mL via INTRAVENOUS

## 2018-10-31 MED ORDER — PROMETHAZINE HCL 25 MG/ML IJ SOLN
25.0000 mg | Freq: Once | INTRAMUSCULAR | Status: AC
  Administered 2018-10-31: 25 mg via INTRAVENOUS
  Filled 2018-10-31: qty 1

## 2018-10-31 MED ORDER — PROMETHAZINE HCL 25 MG PO TABS: 25.0000 mg | ORAL_TABLET | Freq: Four times a day (QID) | ORAL | 0 refills | Status: AC | PRN

## 2018-10-31 MED ORDER — LORAZEPAM 2 MG/ML IJ SOLN
1.0000 mg | Freq: Once | INTRAMUSCULAR | Status: AC
  Administered 2018-10-31: 1 mg via INTRAVENOUS
  Filled 2018-10-31: qty 1

## 2018-10-31 MED ORDER — HALOPERIDOL LACTATE 5 MG/ML IJ SOLN
5.0000 mg | Freq: Once | INTRAMUSCULAR | Status: AC
  Administered 2018-10-31: 5 mg via INTRAVENOUS
  Filled 2018-10-31: qty 1

## 2018-10-31 NOTE — ED Notes (Signed)
Patient verbalizes understanding of discharge instructions. Opportunity for questioning and answers were provided. Armband removed by staff, pt discharged from ED in wheelchair with wife.

## 2018-10-31 NOTE — ED Notes (Signed)
Pts IV taken out and pt started vomiting.  Pt now laying flat in bed and resting.

## 2018-10-31 NOTE — ED Provider Notes (Signed)
[place] EMERGENCY DEPARTMENT Provider Note   CSN: [id number] Arrival date & time: 10/31/18  1601     History   Chief Complaint Chief Complaint  Patient presents with  . Nausea/Vomitting    HPI Steven Black is a 43 y.o. male.  HPI Patient presents to the emergency department with an episode of cyclic vomiting.  The patient states that he had this happen in the past.  The patient states that nothing seems make the condition better or worse.  Patient states that he did not take any medications prior to arrival for his symptoms.  The patient denies chest pain, shortness of breath, headache,blurred vision, neck pain, fever, cough, weakness, numbness, dizziness, anorexia, edema, abdominal pain, diarrhea, rash, back pain, dysuria, hematemesis, bloody stool, near syncope, or syncope. Past Medical History:  Diagnosis Date  . Cannabis hyperemesis syndrome concurrent with and due to cannabis abuse ([place])   . Emesis   . 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 Mega cisterna magna ([place])   . Cannabis hyperemesis syndrome concurrent with and due to cannabis abuse ([place]) 07/24/2018    Past Surgical History:  Procedure Laterality Date  . CHOLECYSTECTOMY          Home Medications    Prior to Admission medications   Medication Sig Start Date End Date Taking? Authorizing Provider  ondansetron (ZOFRAN-ODT) 4 MG disintegrating tablet Take 4 mg by mouth every 8 (eight) hours as needed for nausea or vomiting.   Yes [provider]  promethazine (PHENERGAN) 25 MG tablet Take 1 tablet (25 mg total) by mouth every 6 (six) hours as needed for nausea or vomiting. 07/18/18  Yes Quintella Reichert, MD  LORazepam (ATIVAN) 1 MG tablet Take 1 tablet (1 mg total) by mouth 3 (three) times daily as needed for anxiety (or vertigo). Patient not taking: Reported on 09/32/3557 32/2/02   Delora Fuel, MD    meclizine (ANTIVERT) 25 MG tablet Take 1 tablet (25 mg total) by mouth 3 (three) times daily as needed for dizziness. Patient not taking: Reported on 54/27/0623 76/2/83   Delora Fuel, MD  metoCLOPramide (REGLAN) 10 MG tablet Take 1 tablet (10 mg total) by mouth every 6 (six) hours as needed for nausea (or headache). Patient not taking: Reported on 15/17/6160 73/7/10   Delora Fuel, MD  senna-docusate (SENOKOT-S) 8.6-50 MG tablet Take 1 tablet by mouth 2 (two) times daily. Patient not taking: Reported on 07/27/2018 07/25/18   Radene Gunning, NP    Family History History reviewed. No pertinent family history.  Social History Social History   Tobacco Use  . Smoking status: Current Every Day Smoker    Packs/day: 0.50    Types: Cigarettes  . Smokeless tobacco: Never Used  . Tobacco comment: vape with nicotine  Substance Use Topics  . Alcohol use: No  . Drug use: Yes    Types: Marijuana    Comment: last use " a couple" of months     Allergies   Patient has no known allergies.   Review of Systems Review of Systems All other systems negative except as documented in the HPI. All pertinent positives and negatives as reviewed in the HPI.  Physical Exam Updated Vital Signs BP (!) 148/93   Pulse 82   Temp 99.6 F (37.6 C) (Oral)   Resp 17   Ht 6\' 7"  (2.007 m)   Wt 104.3 kg  SpO2 95%   BMI 25.91 kg/m   Physical Exam Vitals signs and nursing note reviewed.  Constitutional:      General: He is not in acute distress.    Appearance: He is well-developed.  HENT:     Head: Normocephalic and atraumatic.  Eyes:     Pupils: Pupils are equal, round, and reactive to light.  Neck:     Musculoskeletal: Normal range of motion and neck supple.  Cardiovascular:     Rate and Rhythm: Normal rate and regular rhythm.     Heart sounds: Normal heart sounds. No murmur. No friction rub. No gallop.   Pulmonary:     Effort: Pulmonary effort is normal. No respiratory distress.     Breath  sounds: Normal breath sounds. No wheezing.  Abdominal:     General: Bowel sounds are normal. There is no distension.     Palpations: Abdomen is soft.     Tenderness: There is no abdominal tenderness.  Skin:    General: Skin is warm and dry.     Capillary Refill: Capillary refill takes less than 2 seconds.     Findings: No erythema or rash.  Neurological:     Mental Status: He is alert and oriented to person, place, and time.     Motor: No abnormal muscle tone.     Coordination: Coordination normal.  Psychiatric:        Mood and Affect: Mood normal.        Behavior: Behavior normal.      ED Treatments / Results  Labs (all labs ordered are listed, but only abnormal results are displayed) Labs Reviewed  COMPREHENSIVE METABOLIC PANEL - Abnormal; Notable for the following components:      Result Value   CO2 15 (*)    Glucose, Bld 163 (*)    Anion gap 17 (*)    All other components within normal limits  CBC WITH DIFFERENTIAL/PLATELET - Abnormal; Notable for the following components:   WBC 19.3 (*)    RBC 5.99 (*)    Hemoglobin 18.0 (*)    HCT 54.0 (*)    Neutro Abs 16.1 (*)    Monocytes Absolute 1.1 (*)    Abs Immature Granulocytes 0.16 (*)    All other components within normal limits  URINALYSIS, ROUTINE W REFLEX MICROSCOPIC - Abnormal; Notable for the following components:   Ketones, ur 80 (*)    All other components within normal limits    EKG EKG Interpretation  Date/Time:  Friday October 31 2018 08:05:57 EST Ventricular Rate:  66 PR Interval:    QRS Duration: 91 QT Interval:  437 QTC Calculation: 458 R Axis:   64 Text Interpretation:  Sinus rhythm ST elev, probable normal early repol pattern since last tracing no significant change Confirmed by Noemi Chapel [phone]) on 10/31/2018 8:14:02 AM   Radiology No results found.  Procedures Procedures (including critical care time)  Medications Ordered in ED Medications  sodium chloride 0.9 % bolus 1,000 mL (has  no administration in time range)  sodium chloride 0.9 % bolus 1,000 mL (0 mLs Intravenous Stopped 10/31/18 1227)  promethazine (PHENERGAN) injection 25 mg (25 mg Intravenous Given 10/31/18 0836)  LORazepam (ATIVAN) injection 1 mg (1 mg Intravenous Given 10/31/18 0837)  haloperidol lactate (HALDOL) injection 5 mg (5 mg Intravenous Given 10/31/18 0927)  sodium chloride 0.9 % bolus 1,000 mL (0 mLs Intravenous Stopped 10/31/18 1447)  LORazepam (ATIVAN) injection 1 mg (1 mg Intravenous Given 10/31/18 1444)  promethazine (  PHENERGAN) injection 25 mg (25 mg Intravenous Given 10/31/18 1444)     Initial Impression / Assessment and Plan / ED Course  I have reviewed the triage vital signs and the nursing notes.  Pertinent labs & imaging results that were available during my care of the patient were reviewed by me and considered in my medical decision making (see chart for details).     Patient is feeling better at this time.  Have given him IV fluids and he is tolerating fluids.  Patient will be discharged home told return here as needed.  Patient agrees the plan and all questions were answered. Final Clinical Impressions(s) / ED Diagnoses   Final diagnoses:  None    ED Discharge Orders    None       Rebeca Allegra 10/31/18 1522    Noemi Chapel, MD 11/01/18 [phone]

## 2018-10-31 NOTE — ED Provider Notes (Signed)
This patient is a pleasant 43 year old male who unfortunately likely has some type of cyclic vomiting syndrome.  He has been seen by multiple providers in the emergency department but has also undergone evaluation with gastroenterology with both endoscopy and colonoscopy without a definite answer.  His symptoms started within the last 24 hours, recurrent nausea vomiting with some periumbilical tenderness.  On exam he has a soft nonacute abdomen with mild periumbilical tenderness but no distention, no tympanitic sounds, no tachycardia.  Labs show leukocytosis which is nonspecific.  He will be given multiple antiemetics as needed, IV fluids, frequent re-evaluations.  Patient agreeable to the plan, at this time he does not have a surgical abdomen, he has had a prior cholecystectomy and he is having stools making SBO less likely.   EKG Interpretation  Date/Time:  Friday October 31 2018 08:05:57 EST Ventricular Rate:  66 PR Interval:    QRS Duration: 91 QT Interval:  437 QTC Calculation: 458 R Axis:   64 Text Interpretation:  Sinus rhythm ST elev, probable normal early repol pattern since last tracing no significant change Confirmed by Noemi Chapel [phone]) on 10/31/2018 8:14:02 AM        Medical screening examination/treatment/procedure(s) were conducted as a shared visit with non-physician practitioner(s) and myself.  I personally evaluated the patient during the encounter.  Clinical Impression:   Final diagnoses:  Cyclical vomiting         Noemi Chapel, MD 11/01/18 1733

## 2018-10-31 NOTE — Discharge Instructions (Addendum)
Return here as needed.  Follow-up with your primary doctor.  Slowly increase your fluid intake.

## 2018-10-31 NOTE — ED Triage Notes (Addendum)
Pt c/o n/v since 0430, denies blood in his vomit. Stomach pains rating 8/10 from vomiting 8 times total before his arrival. He took 2 zofran at home with no relief of symptoms.

## 2018-11-04 ENCOUNTER — Encounter (HOSPITAL_COMMUNITY): Payer: Self-pay

## 2018-11-04 ENCOUNTER — Emergency Department (HOSPITAL_COMMUNITY)
Admission: EM | Admit: 2018-11-04 | Discharge: 2018-11-04 | Disposition: A | Discharge status: Home / Self Care | Payer: 59 | Attending: Emergency Medicine | Admitting: Emergency Medicine

## 2018-11-04 ENCOUNTER — Other Ambulatory Visit: Payer: Self-pay

## 2018-11-04 DIAGNOSIS — R112 Nausea with vomiting, unspecified: Secondary | ICD-10-CM | POA: Diagnosis present

## 2018-11-04 DIAGNOSIS — F17210 Nicotine dependence, cigarettes, uncomplicated: Secondary | ICD-10-CM | POA: Diagnosis not present

## 2018-11-04 DIAGNOSIS — Z79899 Other long term (current) drug therapy: Secondary | ICD-10-CM | POA: Insufficient documentation

## 2018-11-04 LAB — COMPREHENSIVE METABOLIC PANEL
ALT: 43 U/L (ref 0–44)
AST: 33 U/L (ref 15–41)
Albumin: 4.5 g/dL (ref 3.5–5.0)
Alkaline Phosphatase: 55 U/L (ref 38–126)
Anion gap: 15 (ref 5–15)
BUN: 6 mg/dL (ref 6–20)
CO2: 18 mmol/L — ABNORMAL LOW (ref 22–32)
Calcium: 10 mg/dL (ref 8.9–10.3)
Chloride: 107 mmol/L (ref 98–111)
Creatinine, Ser: 1.06 mg/dL (ref 0.61–1.24)
GFR calc Af Amer: >60 mL/min (ref >60)
GFR calc non Af Amer: >60 mL/min (ref >60)
Glucose, Bld: 133 mg/dL — ABNORMAL HIGH (ref 70–99)
Potassium: 3.8 mmol/L (ref 3.5–5.1)
Sodium: 140 mmol/L (ref 135–145)
Total Bilirubin: 1 mg/dL (ref 0.3–1.2)
Total Protein: 7.7 g/dL (ref 6.5–8.1)

## 2018-11-04 LAB — CBC
HCT: 53.8 % — ABNORMAL HIGH (ref 39.0–52.0)
Hemoglobin: 18.2 g/dL — ABNORMAL HIGH (ref 13.0–17.0)
MCH: 30.7 pg (ref 26.0–34.0)
MCHC: 33.8 g/dL (ref 30.0–36.0)
MCV: 90.9 fL (ref 80.0–100.0)
Platelets: 224 10*3/uL (ref 150–400)
RBC: 5.92 MIL/uL — ABNORMAL HIGH (ref 4.22–5.81)
RDW: 13.3 % (ref 11.5–15.5)
WBC: 23.9 10*3/uL — ABNORMAL HIGH (ref 4.0–10.5)
nRBC: 0 % (ref 0.0–0.2)

## 2018-11-04 LAB — LIPASE, BLOOD: Lipase: 29 U/L (ref 11–51)

## 2018-11-04 MED ORDER — HYDROMORPHONE HCL 1 MG/ML IJ SOLN
0.5000 mg | Freq: Once | INTRAMUSCULAR | Status: AC
  Administered 2018-11-04: 0.5 mg via INTRAVENOUS
  Filled 2018-11-04: qty 1

## 2018-11-04 MED ORDER — LACTATED RINGERS IV BOLUS: 2000.0000 mL | Freq: Once | INTRAVENOUS | Status: DC

## 2018-11-04 MED ORDER — SODIUM CHLORIDE 0.9 % IV BOLUS
1000.0000 mL | Freq: Once | INTRAVENOUS | Status: AC
  Administered 2018-11-04: 1000 mL via INTRAVENOUS

## 2018-11-04 MED ORDER — ONDANSETRON HCL 4 MG/2ML IJ SOLN
4.0000 mg | Freq: Once | INTRAMUSCULAR | Status: AC
  Administered 2018-11-04: 4 mg via INTRAVENOUS
  Filled 2018-11-04: qty 2

## 2018-11-04 MED ORDER — LORAZEPAM 2 MG/ML IJ SOLN
1.0000 mg | Freq: Once | INTRAMUSCULAR | Status: AC
  Administered 2018-11-04: 1 mg via INTRAVENOUS
  Filled 2018-11-04: qty 1

## 2018-11-04 MED ORDER — SODIUM CHLORIDE 0.9% FLUSH: 3.0000 mL | Freq: Once | INTRAVENOUS | Status: DC

## 2018-11-04 MED ORDER — HALOPERIDOL LACTATE 5 MG/ML IJ SOLN
2.0000 mg | Freq: Once | INTRAMUSCULAR | Status: AC
  Administered 2018-11-04: 2 mg via INTRAVENOUS
  Filled 2018-11-04: qty 1

## 2018-11-04 MED ORDER — FAMOTIDINE IN NACL 20-0.9 MG/50ML-% IV SOLN
20.0000 mg | Freq: Once | INTRAVENOUS | Status: AC
  Administered 2018-11-04: 20 mg via INTRAVENOUS
  Filled 2018-11-04: qty 50

## 2018-11-04 MED ORDER — PROCHLORPERAZINE MALEATE 10 MG PO TABS: 10.0000 mg | ORAL_TABLET | Freq: Two times a day (BID) | ORAL | 0 refills | Status: AC | PRN

## 2018-11-04 NOTE — ED Triage Notes (Signed)
Pt here for emesis and abdominal pain.  Seen here on 2/14 for the same.  In the waiting room prior to triage patient has become diaphoretic and increased in vomiting episodes. Pain began at 1330, patient took prescribed zofran at 1330. No relief from zofran. States he's been here for same and needed haldol before.

## 2018-11-04 NOTE — ED Notes (Signed)
Pt st's abd pain is returning, pt dry heaving at this time.  Dr. Wilson Singer made aware

## 2018-11-06 NOTE — ED Provider Notes (Signed)
[facility] EMERGENCY DEPARTMENT Provider Note   CSN: [id number] Arrival date & time: 11/04/18  1525    History   Chief Complaint Chief Complaint  Patient presents with  . Emesis    HPI Steven Black is a 43 y.o. male.    HPI   43yM with abdominal pain and n/v. Recurrent history of the same. Seen in the ED sporadically for it including a few days ago. Reports diffuse abdominal pain. Doesn't localize. Multiple episodes of vomiting. No blood. No fever or chills. No diarrhea. No sick contacts. Has taken zofran at home w/o improvement.   Past Medical History:  Diagnosis Date  . Cannabis hyperemesis syndrome concurrent with and due to cannabis abuse ([place])   . Emesis   . 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 Cannabis hyperemesis syndrome concurrent with and due to cannabis abuse ([place]) 07/24/2018    Past Surgical History:  Procedure Laterality Date  . CHOLECYSTECTOMY          Home Medications    Prior to Admission medications   Medication Sig Start Date End Date Taking? Authorizing Provider  ondansetron (ZOFRAN-ODT) 4 MG disintegrating tablet Take 4 mg by mouth every 8 (eight) hours as needed for nausea or vomiting.   Yes [provider]  LORazepam (ATIVAN) 1 MG tablet Take 1 tablet (1 mg total) by mouth 3 (three) times daily as needed for anxiety (or vertigo). 23/5/36   Delora Fuel, MD  meclizine (ANTIVERT) 25 MG tablet Take 1 tablet (25 mg total) by mouth 3 (three) times daily as needed for dizziness. 14/4/31   Delora Fuel, MD  metoCLOPramide (REGLAN) 10 MG tablet Take 1 tablet (10 mg total) by mouth every 6 (six) hours as needed for nausea (or headache). 54/0/08   Delora Fuel, MD  prochlorperazine (COMPAZINE) 10 MG tablet Take 1 tablet (10 mg total) by mouth 2 (two) times daily as needed for nausea or vomiting. 11/04/18   Virgel Manifold,  MD  promethazine (PHENERGAN) 25 MG tablet Take 1 tablet (25 mg total) by mouth every 6 (six) hours as needed for nausea or vomiting. 10/31/18   Lawyer, Harrell Gave, PA-C  senna-docusate (SENOKOT-S) 8.6-50 MG tablet Take 1 tablet by mouth 2 (two) times daily. 07/25/18   Black, Lezlie Octave, NP    Family History History reviewed. No pertinent family history.  Social History Social History   Tobacco Use  . Smoking status: Current Every Day Smoker    Packs/day: 0.50    Types: Cigarettes  . Smokeless tobacco: Never Used  . Tobacco comment: vape with nicotine  Substance Use Topics  . Alcohol use: No  . Drug use: Yes    Types: Marijuana    Comment: last use " a couple" of months     Allergies   Patient has no known allergies.   Review of Systems Review of Systems  All systems reviewed and negative, other than as noted in HPI.  Physical Exam Updated Vital Signs BP (!) 111/55 (BP Location: Left Arm)   Pulse 77   Temp (!) 77 F (25 C)   Resp 16   SpO2 95%   Physical Exam Vitals signs and nursing note reviewed.  Constitutional:      General: He is not in acute distress.    Appearance: He is well-developed.  HENT:     Head:  Normocephalic and atraumatic.  Eyes:     General:        Right eye: No discharge.        Left eye: No discharge.     Conjunctiva/sclera: Conjunctivae normal.  Neck:     Musculoskeletal: Neck supple.  Cardiovascular:     Rate and Rhythm: Normal rate and regular rhythm.     Heart sounds: Normal heart sounds. No murmur. No friction rub. No gallop.   Pulmonary:     Effort: Pulmonary effort is normal. No respiratory distress.     Breath sounds: Normal breath sounds.  Abdominal:     General: There is no distension.     Palpations: Abdomen is soft.     Tenderness: There is no abdominal tenderness.  Musculoskeletal:        General: No tenderness.  Skin:    General: Skin is warm and dry.  Neurological:     Mental Status: He is alert.  Psychiatric:          Behavior: Behavior normal.        Thought Content: Thought content normal.      ED Treatments / Results  Labs (all labs ordered are listed, but only abnormal results are displayed) Labs Reviewed  COMPREHENSIVE METABOLIC PANEL - Abnormal; Notable for the following components:      Result Value   CO2 18 (*)    Glucose, Bld 133 (*)    All other components within normal limits  CBC - Abnormal; Notable for the following components:   WBC 23.9 (*)    RBC 5.92 (*)    Hemoglobin 18.2 (*)    HCT 53.8 (*)    All other components within normal limits  LIPASE, BLOOD    EKG EKG Interpretation  Date/Time:  Tuesday November 04 2018 16:18:34 EST Ventricular Rate:  90 PR Interval:  120 QRS Duration: 82 QT Interval:  380 QTC Calculation: 464 R Axis:   82 Text Interpretation:  Normal sinus rhythm Nonspecific ST abnormality Abnormal ECG Confirmed by Virgel Manifold [phone]) on 11/04/2018 5:54:19 PM Also confirmed by Virgel Manifold [phone]), editor Philomena Doheny [phone])  on 11/05/2018 7:11:12 AM   Radiology No results found.  Procedures Procedures (including critical care time)  Medications Ordered in ED Medications  haloperidol lactate (HALDOL) injection 2 mg (2 mg Intravenous Given 11/04/18 1910)  famotidine (PEPCID) IVPB 20 mg premix (0 mg Intravenous Stopped 11/04/18 2050)  sodium chloride 0.9 % bolus 1,000 mL (0 mLs Intravenous Stopped 11/04/18 2006)  sodium chloride 0.9 % bolus 1,000 mL (0 mLs Intravenous Stopped 11/04/18 2248)  ondansetron (ZOFRAN) injection 4 mg (4 mg Intravenous Given 11/04/18 2153)  LORazepam (ATIVAN) injection 1 mg (1 mg Intravenous Given 11/04/18 2153)  HYDROmorphone (DILAUDID) injection 0.5 mg (0.5 mg Intravenous Given 11/04/18 2155)     Initial Impression / Assessment and Plan / ED Course  I have reviewed the triage vital signs and the nursing notes.  Pertinent labs & imaging results that were available during my care of the patient were reviewed by me and  considered in my medical decision making (see chart for details).        42yM with abdominal pain and n/v. Gastritis, peptic ulcer disease, biliary colic, cholelithiasis, cholecystitis, cholangitis, hepatitis, renal colic, urinary tract infection, colitis, constipation, gastroenteritis, atypical ACS, mesenteric ischemia all considered among other etiologies in the patient's differential diagnosis.  Suspect exacerbation of cyclic vomiting syndrome. Apparently hx of cannabis hyperemesis syndrome but he denies marijuana usage to  me. Abdominal exam reassuring. Treated symptomatically with improvement.     Final Clinical Impressions(s) / ED Diagnoses   Final diagnoses:  Nausea and vomiting, intractability of vomiting not specified, unspecified vomiting type    ED Discharge Orders         Ordered    prochlorperazine (COMPAZINE) 10 MG tablet  2 times daily PRN     11/04/18 2240           Virgel Manifold, MD 11/06/18 [phone]

## 2018-12-07 ENCOUNTER — Other Ambulatory Visit: Payer: Self-pay

## 2018-12-07 ENCOUNTER — Encounter (HOSPITAL_COMMUNITY): Payer: Self-pay | Admitting: Emergency Medicine

## 2018-12-07 ENCOUNTER — Emergency Department (HOSPITAL_COMMUNITY)
Admission: EM | Admit: 2018-12-07 | Discharge: 2018-12-07 | Disposition: A | Discharge status: Home / Self Care | Payer: 59 | Attending: Emergency Medicine | Admitting: Emergency Medicine

## 2018-12-07 ENCOUNTER — Emergency Department (HOSPITAL_COMMUNITY): Payer: 59

## 2018-12-07 DIAGNOSIS — R0981 Nasal congestion: Secondary | ICD-10-CM | POA: Diagnosis present

## 2018-12-07 DIAGNOSIS — Z79899 Other long term (current) drug therapy: Secondary | ICD-10-CM | POA: Diagnosis not present

## 2018-12-07 DIAGNOSIS — J069 Acute upper respiratory infection, unspecified: Secondary | ICD-10-CM | POA: Diagnosis not present

## 2018-12-07 DIAGNOSIS — F17210 Nicotine dependence, cigarettes, uncomplicated: Secondary | ICD-10-CM | POA: Insufficient documentation

## 2018-12-07 DIAGNOSIS — B9789 Other viral agents as the cause of diseases classified elsewhere: Secondary | ICD-10-CM

## 2018-12-07 LAB — INFLUENZA PANEL BY PCR (TYPE A & B)
INFLAPCR: NEGATIVE
INFLBPCR: NEGATIVE

## 2018-12-07 MED ORDER — SODIUM CHLORIDE 0.9 % IV BOLUS
1000.0000 mL | Freq: Once | INTRAVENOUS | Status: AC
  Administered 2018-12-07: 1000 mL via INTRAVENOUS

## 2018-12-07 MED ORDER — IBUPROFEN 400 MG PO TABS
600.0000 mg | ORAL_TABLET | Freq: Once | ORAL | Status: AC
  Administered 2018-12-07: 600 mg via ORAL
  Filled 2018-12-07: qty 1

## 2018-12-07 MED ORDER — ACETAMINOPHEN 325 MG PO TABS
650.0000 mg | ORAL_TABLET | Freq: Once | ORAL | Status: AC
  Administered 2018-12-07: 650 mg via ORAL
  Filled 2018-12-07: qty 2

## 2018-12-07 MED ORDER — BENZONATATE 100 MG PO CAPS: 200.0000 mg | ORAL_CAPSULE | Freq: Three times a day (TID) | ORAL | 0 refills | Status: AC

## 2018-12-07 MED ORDER — FLUTICASONE PROPIONATE 50 MCG/ACT NA SUSP: 1.0000 | Freq: Every day | NASAL | 2 refills | Status: AC

## 2018-12-07 NOTE — ED Triage Notes (Signed)
Pt in for flu like symptoms. Pt has been having body aches. Pt started with a runny nose on Thursday and progressed since then. Pt now is febrile with 101.0 fever and body aches. Pt has a fiances who brother was in [place] for nursing and  Recently traveled back but has not had direct contact with him. pts fiance has been around brother from [place] but is having no symptoms.

## 2018-12-07 NOTE — ED Provider Notes (Signed)
[facility] EMERGENCY DEPARTMENT Provider Note   CSN: [id number] Arrival date & time: 12/07/18  1827    History   Chief Complaint No chief complaint on file.   HPI Steven Black is a 43 y.o. male with a past medical history of gastroparesis presents to ED for influenza-like illness since 12/04/2018.  States that symptoms began as allergy symptoms including runny nose.  Progressively felt more generalized body aches, bilateral ear pain, dry cough, fever with T-max 101.  He took 1 dose of Tylenol approximately 8 hours ago as well as trying DayQuil and NyQuil.  Patient denies any recent travel but did have a sick contacts including his friends daughter who was diagnosed with influenza B.  He was concerned because his fiance was in contact with her brother who traveled to [place] for work.  Neither fianc nor brother had any symptoms of respiratory illness.  Patient was not in direct contact with his fianc's brother.  Patient himself has not had any international travel, shortness of breath, chest pain, abdominal pain, vomiting, sore throat.     HPI  Past Medical History:  Diagnosis Date  . Cannabis hyperemesis syndrome concurrent with and due to cannabis abuse ([place])   . Emesis   . 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 Cannabis hyperemesis syndrome concurrent with and due to cannabis abuse ([place]) 07/24/2018    Past Surgical History:  Procedure Laterality Date  . CHOLECYSTECTOMY          Home Medications    Prior to Admission medications   Medication Sig Start Date End Date Taking? Authorizing Provider  benzonatate (TESSALON) 100 MG capsule Take 2 capsules (200 mg total) by mouth every 8 (eight) hours. 12/07/18   Fatina Sprankle, PA-C  fluticasone (FLONASE) 50 MCG/ACT nasal spray Place 1 spray into both nostrils daily. 12/07/18   Precious Gilchrest, PA-C   LORazepam (ATIVAN) 1 MG tablet Take 1 tablet (1 mg total) by mouth 3 (three) times daily as needed for anxiety (or vertigo). Patient not taking: Reported on 0/93/2671 24/5/80   Delora Fuel, MD  meclizine (ANTIVERT) 25 MG tablet Take 1 tablet (25 mg total) by mouth 3 (three) times daily as needed for dizziness. Patient not taking: Reported on 9/98/3382 50/5/39   Delora Fuel, MD  metoCLOPramide (REGLAN) 10 MG tablet Take 1 tablet (10 mg total) by mouth every 6 (six) hours as needed for nausea (or headache). Patient not taking: Reported on 7/67/3419 37/9/02   Delora Fuel, MD  ondansetron (ZOFRAN-ODT) 4 MG disintegrating tablet Take 4 mg by mouth every 8 (eight) hours as needed for nausea or vomiting.    [provider]  prochlorperazine (COMPAZINE) 10 MG tablet Take 1 tablet (10 mg total) by mouth 2 (two) times daily as needed for nausea or vomiting. Patient not taking: Reported on 12/07/2018 11/04/18   Virgel Manifold, MD  promethazine (PHENERGAN) 25 MG tablet Take 1 tablet (25 mg total) by mouth every 6 (six) hours as needed for nausea or vomiting. Patient not taking: Reported on 12/07/2018 10/31/18   Dalia Heading, PA-C  senna-docusate (SENOKOT-S) 8.6-50 MG tablet Take 1 tablet by mouth 2 (two) times daily. Patient not taking: Reported on 12/07/2018 07/25/18   Radene Gunning, NP    Family History No family history on file.  Social History Social History   Tobacco Use  .  Smoking status: Current Every Day Smoker    Packs/day: 0.50    Types: Cigarettes  . Smokeless tobacco: Never Used  . Tobacco comment: vape with nicotine  Substance Use Topics  . Alcohol use: No  . Drug use: Yes    Types: Marijuana    Comment: last use " a couple" of months     Allergies   Patient has no known allergies.   Review of Systems Review of Systems  Constitutional: Positive for fever. Negative for appetite change and chills.  HENT: Positive for ear pain and rhinorrhea. Negative for  sneezing and sore throat.   Eyes: Negative for photophobia and visual disturbance.  Respiratory: Positive for cough. Negative for chest tightness, shortness of breath and wheezing.   Cardiovascular: Negative for chest pain and palpitations.  Gastrointestinal: Negative for abdominal pain, blood in stool, constipation, diarrhea, nausea and vomiting.  Genitourinary: Negative for dysuria, hematuria and urgency.  Musculoskeletal: Positive for myalgias.  Skin: Negative for rash.  Neurological: Negative for dizziness, weakness and light-headedness.     Physical Exam Updated Vital Signs BP 116/73 (BP Location: Left Arm)   Pulse 89 Comment: Simultaneous filing. User may not have seen previous data.  Temp 100 F (37.8 C) (Oral)   Resp 20   Ht 6\' 7"  (2.007 m)   Wt 99.8 kg   SpO2 97% Comment: Simultaneous filing. User may not have seen previous data.  BMI 24.78 kg/m   Physical Exam Vitals signs and nursing note reviewed.  Constitutional:      General: He is not in acute distress.    Appearance: He is well-developed.  HENT:     Head: Normocephalic and atraumatic.     Right Ear: A middle ear effusion is present. Tympanic membrane is erythematous.     Left Ear: Tympanic membrane is scarred and erythematous.     Nose: Nose normal.  Eyes:     General: No scleral icterus.       Left eye: No discharge.     Conjunctiva/sclera: Conjunctivae normal.  Neck:     Musculoskeletal: Normal range of motion and neck supple.  Cardiovascular:     Rate and Rhythm: Regular rhythm. Tachycardia present.     Heart sounds: Normal heart sounds. No murmur. No friction rub. No gallop.   Pulmonary:     Effort: Pulmonary effort is normal. No respiratory distress.     Breath sounds: Normal breath sounds.  Abdominal:     General: Bowel sounds are normal. There is no distension.     Palpations: Abdomen is soft.     Tenderness: There is no abdominal tenderness. There is no guarding.  Musculoskeletal: Normal  range of motion.  Skin:    General: Skin is warm and dry.     Findings: No rash.  Neurological:     Mental Status: He is alert.     Motor: No abnormal muscle tone.     Coordination: Coordination normal.      ED Treatments / Results  Labs (all labs ordered are listed, but only abnormal results are displayed) Labs Reviewed  INFLUENZA PANEL BY PCR (TYPE A & B)    EKG None  Radiology Dg Chest Portable 1 View  Result Date: 12/07/2018 CLINICAL DATA:  Cough and fever EXAM: PORTABLE CHEST 1 VIEW COMPARISON:  07/24/2018 FINDINGS: The heart size and mediastinal contours are within normal limits. Both lungs are clear. The visualized skeletal structures are unremarkable. IMPRESSION: No active disease. Electronically Signed   By: Elta Guadeloupe  Lukens M.D.   On: 12/07/2018 21:23    Procedures Procedures (including critical care time)  Medications Ordered in ED Medications  sodium chloride 0.9 % bolus 1,000 mL (0 mLs Intravenous Stopped 12/07/18 2240)  acetaminophen (TYLENOL) tablet 650 mg (650 mg Oral Given 12/07/18 1857)  ibuprofen (ADVIL,MOTRIN) tablet 600 mg (600 mg Oral Given 12/07/18 2128)     Initial Impression / Assessment and Plan / ED Course  I have reviewed the triage vital signs and the nursing notes.  Pertinent labs & imaging results that were available during my care of the patient were reviewed by me and considered in my medical decision making (see chart for details).        43 year old male with past medical history of gastroparesis presents to ED for influenza-like illness since 12/04/2018.  Symptoms began with a runny nose.  He progressively felt more generalized body aches, bilateral ear pain, dry cough and fever with T-max 101.  Last antipyretic use was 8 hours prior to my evaluation.  Sick contacts including his friends daughter who was diagnosed with influenza B.  Patient is concerned because his fiance was in contact with her brother who traveled to [place] for work.   Neither fianc nor brother have had any respiratory symptoms.  Patient was not in direct contact with his fianc's brother.  Patient himself has not had any travel to high risk areas for covid19 or been in contact with anyone that has been tested positive.  He denies any shortness of breath, chest pain or sore throat.  On my exam patient initially tachycardic.  Lungs CTAB. No chest TTP. He was afebrile but developed fever of 100.4. HR improved with defervescence.  Flu swab is negative.  Chest x-ray is negative for acute abnormality.  Fever improved to 100.  Explained to patient that due to current algorithm, testing for the novel coronavirus is not indicated in this case.  He exhibits no signs of respiratory distress.  His vital signs have improved since arrival in the ED.  He was given instructions on quarantine for a total of 2 weeks.  Will give antitussives and Flonase to help with symptoms.  Have him continue antipyretics and increase hydration.  Patient is agreeable to plan.  Lab and follow-up with PCP and return to ED for any severe worsening symptoms.  Patient is hemodynamically stable, in NAD, and able to ambulate in the ED. Evaluation does not show pathology that would require ongoing emergent intervention or inpatient treatment. I explained the diagnosis to the patient. Pain has been managed and has no complaints prior to discharge. Patient is comfortable with above plan and is stable for discharge at this time. All questions were answered prior to disposition. Strict return precautions for returning to the ED were discussed. Encouraged follow up with PCP.    Portions of this note were generated with [employer]. Dictation errors may occur despite best attempts at proofreading.   Final Clinical Impressions(s) / ED Diagnoses   Final diagnoses:  Viral URI with cough    ED Discharge Orders         Ordered    benzonatate (TESSALON) 100 MG capsule  Every 8 hours     12/07/18 2219     fluticasone (FLONASE) 50 MCG/ACT nasal spray  Daily     12/07/18 2219           Delia Heady, PA-C 12/07/18 2242    Drenda Freeze, MD 12/07/18 [phone]

## 2018-12-07 NOTE — ED Notes (Signed)
Patient verbalizes understanding of discharge instructions. Opportunity for questioning and answers were provided. Armband removed by staff, pt discharged from ED via wheelchair to home.  

## 2018-12-07 NOTE — Discharge Instructions (Addendum)
It would be important for you to self quarantine for 2 weeks total. You need to stay hydrated, take OTC medications to help with your fever and body aches. Take Tessalon Perles and Flonase as needed for your symptoms. Return to the ED for worsening symptoms, if you develop shortness of breath, chest pain, vomiting or coughing up blood or leg swelling.

## 2019-02-28 ENCOUNTER — Other Ambulatory Visit: Payer: Self-pay

## 2019-02-28 ENCOUNTER — Emergency Department (HOSPITAL_COMMUNITY)
Admission: EM | Admit: 2019-02-28 | Discharge: 2019-02-28 | Disposition: A | Discharge status: Home / Self Care | Payer: 59 | Attending: Emergency Medicine | Admitting: Emergency Medicine

## 2019-02-28 DIAGNOSIS — R1084 Generalized abdominal pain: Secondary | ICD-10-CM | POA: Insufficient documentation

## 2019-02-28 DIAGNOSIS — F17210 Nicotine dependence, cigarettes, uncomplicated: Secondary | ICD-10-CM | POA: Insufficient documentation

## 2019-02-28 DIAGNOSIS — R1115 Cyclical vomiting syndrome unrelated to migraine: Secondary | ICD-10-CM | POA: Insufficient documentation

## 2019-02-28 LAB — URINALYSIS, ROUTINE W REFLEX MICROSCOPIC
Bilirubin Urine: NEGATIVE
Glucose, UA: NEGATIVE mg/dL
Hgb urine dipstick: NEGATIVE
Ketones, ur: 80 mg/dL — AB
Leukocytes,Ua: NEGATIVE
Nitrite: NEGATIVE
Protein, ur: 30 mg/dL — AB
Specific Gravity, Urine: 1.029 (ref 1.005–1.030)
pH: 6 (ref 5.0–8.0)

## 2019-02-28 LAB — CBC WITH DIFFERENTIAL/PLATELET
Abs Immature Granulocytes: 0.13 10*3/uL — ABNORMAL HIGH (ref 0.00–0.07)
Basophils Absolute: 0.1 10*3/uL (ref 0.0–0.1)
Basophils Relative: 0 %
Eosinophils Absolute: 0 10*3/uL (ref 0.0–0.5)
Eosinophils Relative: 0 %
HCT: 54.9 % — ABNORMAL HIGH (ref 39.0–52.0)
Hemoglobin: 18.7 g/dL — ABNORMAL HIGH (ref 13.0–17.0)
Immature Granulocytes: 1 %
Lymphocytes Relative: 6 %
Lymphs Abs: 1.3 10*3/uL (ref 0.7–4.0)
MCH: 30.7 pg (ref 26.0–34.0)
MCHC: 34.1 g/dL (ref 30.0–36.0)
MCV: 90 fL (ref 80.0–100.0)
Monocytes Absolute: 1.9 10*3/uL — ABNORMAL HIGH (ref 0.1–1.0)
Monocytes Relative: 8 %
Neutro Abs: 20.1 10*3/uL — ABNORMAL HIGH (ref 1.7–7.7)
Neutrophils Relative %: 85 %
Platelets: 218 10*3/uL (ref 150–400)
RBC: 6.1 MIL/uL — ABNORMAL HIGH (ref 4.22–5.81)
RDW: 13.2 % (ref 11.5–15.5)
WBC: 23.5 10*3/uL — ABNORMAL HIGH (ref 4.0–10.5)
nRBC: 0 % (ref 0.0–0.2)

## 2019-02-28 LAB — COMPREHENSIVE METABOLIC PANEL
ALT: 34 U/L (ref 0–44)
AST: 28 U/L (ref 15–41)
Albumin: 4.9 g/dL (ref 3.5–5.0)
Alkaline Phosphatase: 61 U/L (ref 38–126)
Anion gap: 18 — ABNORMAL HIGH (ref 5–15)
BUN: 13 mg/dL (ref 6–20)
CO2: 16 mmol/L — ABNORMAL LOW (ref 22–32)
Calcium: 10.2 mg/dL (ref 8.9–10.3)
Chloride: 106 mmol/L (ref 98–111)
Creatinine, Ser: 1.41 mg/dL — ABNORMAL HIGH (ref 0.61–1.24)
GFR calc Af Amer: >60 mL/min (ref >60)
GFR calc non Af Amer: >60 mL/min (ref >60)
Glucose, Bld: 173 mg/dL — ABNORMAL HIGH (ref 70–99)
Potassium: 3.5 mmol/L (ref 3.5–5.1)
Sodium: 140 mmol/L (ref 135–145)
Total Bilirubin: 1.8 mg/dL — ABNORMAL HIGH (ref 0.3–1.2)
Total Protein: 8.1 g/dL (ref 6.5–8.1)

## 2019-02-28 LAB — RAPID URINE DRUG SCREEN, HOSP PERFORMED
Amphetamines: NOT DETECTED
Barbiturates: NOT DETECTED
Benzodiazepines: NOT DETECTED
Cocaine: NOT DETECTED
Opiates: NOT DETECTED
Tetrahydrocannabinol: POSITIVE — AB

## 2019-02-28 LAB — LIPASE, BLOOD: Lipase: 21 U/L (ref 11–51)

## 2019-02-28 MED ORDER — SODIUM CHLORIDE 0.9 % IV BOLUS (SEPSIS)
1000.0000 mL | Freq: Once | INTRAVENOUS | Status: AC
  Administered 2019-02-28: 1000 mL via INTRAVENOUS

## 2019-02-28 MED ORDER — ONDANSETRON HCL 4 MG/2ML IJ SOLN
4.0000 mg | Freq: Once | INTRAMUSCULAR | Status: AC
  Administered 2019-02-28: 4 mg via INTRAVENOUS
  Filled 2019-02-28: qty 2

## 2019-02-28 MED ORDER — ONDANSETRON HCL 4 MG PO TABS: 4.0000 mg | ORAL_TABLET | Freq: Four times a day (QID) | ORAL | 0 refills | Status: AC

## 2019-02-28 MED ORDER — HALOPERIDOL LACTATE 5 MG/ML IJ SOLN
5.0000 mg | Freq: Once | INTRAMUSCULAR | Status: AC
  Administered 2019-02-28: 5 mg via INTRAVENOUS
  Filled 2019-02-28: qty 1

## 2019-02-28 MED ORDER — ONDANSETRON HCL 4 MG/2ML IJ SOLN
4.0000 mg | Freq: Once | INTRAMUSCULAR | Status: AC
  Administered 2019-02-28: 16:00:00 4 mg via INTRAVENOUS
  Filled 2019-02-28: qty 2

## 2019-02-28 MED ORDER — SODIUM CHLORIDE 0.9 % IV BOLUS (SEPSIS)
1000.0000 mL | Freq: Once | INTRAVENOUS | Status: AC
  Administered 2019-02-28: 17:00:00 1000 mL via INTRAVENOUS

## 2019-02-28 NOTE — Discharge Instructions (Signed)
Thank you for allowing me to care for you today. Please return to the emergency department if you have new or worsening symptoms. Take your medications as instructed.  ° °

## 2019-02-28 NOTE — ED Triage Notes (Signed)
Vomiting since yesterday    abd pain   Hx of the same

## 2019-02-28 NOTE — ED Notes (Signed)
Asked pt if he could provide a urine sample. Pt refused, provided urinal

## 2019-02-28 NOTE — ED Notes (Signed)
Pt smiling he reports his pain is better unless he moves aroun  No vomiting since he arrived and received the med

## 2019-02-28 NOTE — ED Provider Notes (Signed)
[facility] EMERGENCY DEPARTMENT Provider Note   CSN: [id number] Arrival date & time: 02/28/19  1516    History   Chief Complaint Chief Complaint  Patient presents with  . Emesis    HPI Steven Black is a 43 y.o. male.     Patient is a 43 year old gentleman with past medical history of cannabis hyperemesis, mega cisterna magna, presenting to the ER for n/v/abdominal pain starting this morning. Patient states "I have gastroparesis, this is my gastroparesis". Patient reports abdominal pain all over but worse in the RUQ with dry heaves and several episodes of vomiting. He reports fluids, Zofran and haldol usually improve his symptoms. He denies use of THC, drugs or alcohol in the last several weeks. Denies fever, chills, cough, dysuria     Past Medical History:  Diagnosis Date  . Cannabis hyperemesis syndrome concurrent with and due to cannabis abuse ([place])   . Emesis   . 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 Cannabis hyperemesis syndrome concurrent with and due to cannabis abuse ([place]) 07/24/2018    Past Surgical History:  Procedure Laterality Date  . CHOLECYSTECTOMY          Home Medications    Prior to Admission medications   Medication Sig Start Date End Date Taking? Authorizing Provider  benzonatate (TESSALON) 100 MG capsule Take 2 capsules (200 mg total) by mouth every 8 (eight) hours. 12/07/18   Khatri, Hina, PA-C  fluticasone (FLONASE) 50 MCG/ACT nasal spray Place 1 spray into both nostrils daily. 12/07/18   Khatri, Hina, PA-C  LORazepam (ATIVAN) 1 MG tablet Take 1 tablet (1 mg total) by mouth 3 (three) times daily as needed for anxiety (or vertigo). Patient not taking: Reported on 2/42/3536 14/4/31   Delora Fuel, MD  meclizine (ANTIVERT) 25 MG tablet Take 1 tablet (25 mg total) by mouth 3 (three) times daily as needed for dizziness.  Patient not taking: Reported on 5/40/0867 61/9/50   Delora Fuel, MD  metoCLOPramide (REGLAN) 10 MG tablet Take 1 tablet (10 mg total) by mouth every 6 (six) hours as needed for nausea (or headache). Patient not taking: Reported on 9/32/6712 45/8/09   Delora Fuel, MD  ondansetron (ZOFRAN) 4 MG tablet Take 1 tablet (4 mg total) by mouth every 6 (six) hours. 02/28/19   Madilyn Hook A, PA-C  ondansetron (ZOFRAN-ODT) 4 MG disintegrating tablet Take 4 mg by mouth every 8 (eight) hours as needed for nausea or vomiting.    [provider]  prochlorperazine (COMPAZINE) 10 MG tablet Take 1 tablet (10 mg total) by mouth 2 (two) times daily as needed for nausea or vomiting. Patient not taking: Reported on 12/07/2018 11/04/18   Virgel Manifold, MD  promethazine (PHENERGAN) 25 MG tablet Take 1 tablet (25 mg total) by mouth every 6 (six) hours as needed for nausea or vomiting. Patient not taking: Reported on 12/07/2018 10/31/18   Dalia Heading, PA-C  senna-docusate (SENOKOT-S) 8.6-50 MG tablet Take 1 tablet by mouth 2 (two) times daily. Patient not taking: Reported on 12/07/2018 07/25/18   Radene Gunning, NP    Family History No family history on file.  Social History Social History   Tobacco Use  . Smoking status: Current Every Day Smoker    Packs/day: 0.50    Types: Cigarettes  . Smokeless tobacco: Never Used  . Tobacco comment: vape with  nicotine  Substance Use Topics  . Alcohol use: No  . Drug use: Yes    Types: Marijuana    Comment: last use " a couple" of months     Allergies   Patient has no known allergies.   Review of Systems Review of Systems  Constitutional: Negative for activity change, appetite change, fatigue and fever.  Cardiovascular: Negative for chest pain.  Gastrointestinal: Positive for abdominal pain, nausea and vomiting. Negative for blood in stool and diarrhea.  Endocrine: Negative for polyuria.  Genitourinary: Negative for dysuria.  Skin: Negative for  rash.  Allergic/Immunologic: Negative for environmental allergies and food allergies.  Neurological: Negative for dizziness, light-headedness and headaches.     Physical Exam Updated Vital Signs BP 115/60   Pulse 79   Temp 98.2 F (36.8 C)   Resp (!) 22   Ht 6\' 7"  (2.007 m)   Wt 99.8 kg   SpO2 95%   BMI 24.78 kg/m   Physical Exam Vitals signs and nursing note reviewed.  Constitutional:      Appearance: Normal appearance.  HENT:     Head: Normocephalic.     Mouth/Throat:     Mouth: Mucous membranes are moist.  Eyes:     Conjunctiva/sclera: Conjunctivae normal.  Cardiovascular:     Rate and Rhythm: Regular rhythm. Tachycardia present.     Pulses: Normal pulses.  Pulmonary:     Effort: Pulmonary effort is normal.  Abdominal:     General: Abdomen is flat.     Tenderness: There is abdominal tenderness. There is guarding (RUQ).  Skin:    General: Skin is dry.  Neurological:     Mental Status: He is alert.  Psychiatric:        Mood and Affect: Mood normal.      ED Treatments / Results  Labs (all labs ordered are listed, but only abnormal results are displayed) Labs Reviewed  CBC WITH DIFFERENTIAL/PLATELET - Abnormal; Notable for the following components:      Result Value   WBC 23.5 (*)    RBC 6.10 (*)    Hemoglobin 18.7 (*)    HCT 54.9 (*)    Neutro Abs 20.1 (*)    Monocytes Absolute 1.9 (*)    Abs Immature Granulocytes 0.13 (*)    All other components within normal limits  COMPREHENSIVE METABOLIC PANEL - Abnormal; Notable for the following components:   CO2 16 (*)    Glucose, Bld 173 (*)    Creatinine, Ser 1.41 (*)    Total Bilirubin 1.8 (*)    Anion gap 18 (*)    All other components within normal limits  URINALYSIS, ROUTINE W REFLEX MICROSCOPIC - Abnormal; Notable for the following components:   Color, Urine AMBER (*)    Ketones, ur 80 (*)    Protein, ur 30 (*)    Bacteria, UA RARE (*)    All other components within normal limits  RAPID URINE  DRUG SCREEN, HOSP PERFORMED - Abnormal; Notable for the following components:   Tetrahydrocannabinol POSITIVE (*)    All other components within normal limits  LIPASE, BLOOD    EKG None  Radiology No results found.  Procedures Procedures (including critical care time)  Medications Ordered in ED Medications  ondansetron (ZOFRAN) injection 4 mg (4 mg Intravenous Given 02/28/19 1546)  sodium chloride 0.9 % bolus 1,000 mL (0 mLs Intravenous Stopped 02/28/19 1649)  haloperidol lactate (HALDOL) injection 5 mg (5 mg Intravenous Given 02/28/19 1546)  sodium chloride 0.9 %  bolus 1,000 mL (1,000 mLs Intravenous New Bag/Given 02/28/19 1657)  ondansetron (ZOFRAN) injection 4 mg (4 mg Intravenous Given 02/28/19 1811)     Initial Impression / Assessment and Plan / ED Course  I have reviewed the triage vital signs and the nursing notes.  Pertinent labs & imaging results that were available during my care of the patient were reviewed by me and considered in my medical decision making (see chart for details).  Clinical Course as of Feb 28 1812  Sat Feb 28, 2019  1649 Patient was sleeping when I entered the room but was easily awakened.  He reports that his pain is much improved and he has not had any episodes of vomiting since given the Zofran and Haldol.   [KM]  4782 Patient reports that he is very much improved with Haldol.  Patient reports that he would like to go home.  Patient is on his second liter of fluids but reports he does not want to finish the second liter of fluids and would like to just go home to get his daughter.  Patient advised to make sure he is drinking plenty oral fluids at home and he was given a prescription for Zofran.  Patient is adamant that this is his gastroparesis and not THC hyperemesis.  Patient's urine was positive for THC.  Patient advised on strict return precautions.   [KM]    Clinical Course User Index [KM] Alveria Apley, PA-C        Final Clinical  Impressions(s) / ED Diagnoses   Final diagnoses:  Cyclical vomiting    ED Discharge Orders         Ordered    ondansetron (ZOFRAN) 4 MG tablet  Every 6 hours     02/28/19 1806           Kristine Royal 02/28/19 1814    Pattricia Boss, MD 03/01/19 [phone]

## 2019-02-28 NOTE — ED Notes (Signed)
Pt reports that he feels better cannot void now.

## 2019-03-02 ENCOUNTER — Other Ambulatory Visit: Payer: Self-pay

## 2019-03-02 ENCOUNTER — Encounter (HOSPITAL_COMMUNITY): Payer: Self-pay | Admitting: Emergency Medicine

## 2019-03-02 ENCOUNTER — Emergency Department (HOSPITAL_COMMUNITY)
Admission: EM | Admit: 2019-03-02 | Discharge: 2019-03-02 | Disposition: A | Discharge status: Home / Self Care | Payer: 59 | Attending: Emergency Medicine | Admitting: Emergency Medicine

## 2019-03-02 DIAGNOSIS — Z5320 Procedure and treatment not carried out because of patient's decision for unspecified reasons: Secondary | ICD-10-CM | POA: Insufficient documentation

## 2019-03-02 DIAGNOSIS — R109 Unspecified abdominal pain: Secondary | ICD-10-CM | POA: Insufficient documentation

## 2019-03-02 DIAGNOSIS — R112 Nausea with vomiting, unspecified: Secondary | ICD-10-CM | POA: Insufficient documentation

## 2019-03-02 LAB — COMPREHENSIVE METABOLIC PANEL
ALT: 30 U/L (ref 0–44)
AST: 33 U/L (ref 15–41)
Albumin: 4.3 g/dL (ref 3.5–5.0)
Alkaline Phosphatase: 55 U/L (ref 38–126)
Anion gap: 15 (ref 5–15)
BUN: 10 mg/dL (ref 6–20)
CO2: 20 mmol/L — ABNORMAL LOW (ref 22–32)
Calcium: 9.6 mg/dL (ref 8.9–10.3)
Chloride: 102 mmol/L (ref 98–111)
Creatinine, Ser: 1.33 mg/dL — ABNORMAL HIGH (ref 0.61–1.24)
GFR calc Af Amer: >60 mL/min (ref >60)
GFR calc non Af Amer: >60 mL/min (ref >60)
Glucose, Bld: 137 mg/dL — ABNORMAL HIGH (ref 70–99)
Potassium: 3.4 mmol/L — ABNORMAL LOW (ref 3.5–5.1)
Sodium: 137 mmol/L (ref 135–145)
Total Bilirubin: 1.6 mg/dL — ABNORMAL HIGH (ref 0.3–1.2)
Total Protein: 7.5 g/dL (ref 6.5–8.1)

## 2019-03-02 LAB — CBC
HCT: 48.8 % (ref 39.0–52.0)
Hemoglobin: 16.7 g/dL (ref 13.0–17.0)
MCH: 30.7 pg (ref 26.0–34.0)
MCHC: 34.2 g/dL (ref 30.0–36.0)
MCV: 89.7 fL (ref 80.0–100.0)
Platelets: 170 10*3/uL (ref 150–400)
RBC: 5.44 MIL/uL (ref 4.22–5.81)
RDW: 13.2 % (ref 11.5–15.5)
WBC: 15.1 10*3/uL — ABNORMAL HIGH (ref 4.0–10.5)
nRBC: 0 % (ref 0.0–0.2)

## 2019-03-02 LAB — LIPASE, BLOOD: Lipase: 26 U/L (ref 11–51)

## 2019-03-02 MED ORDER — ONDANSETRON HCL 4 MG/2ML IJ SOLN: 4.0000 mg | Freq: Once | INTRAMUSCULAR | Status: DC | PRN

## 2019-03-02 MED ORDER — SODIUM CHLORIDE 0.9% FLUSH: 3.0000 mL | Freq: Once | INTRAVENOUS | Status: DC

## 2019-03-02 NOTE — ED Triage Notes (Signed)
Pt c/o 10/20 abd pain with nausea and vomiting since Saturday.

## 2020-05-19 IMAGING — DX DG ABDOMEN ACUTE W/ 1V CHEST
5 series · 5 of 5 positions shown · non-contrast
Comparison: KUB 07/18/2018

CLINICAL DATA: Abdominal pain and vomiting

EXAM:
DG ABDOMEN ACUTE W/ 1V CHEST

[chest pa]
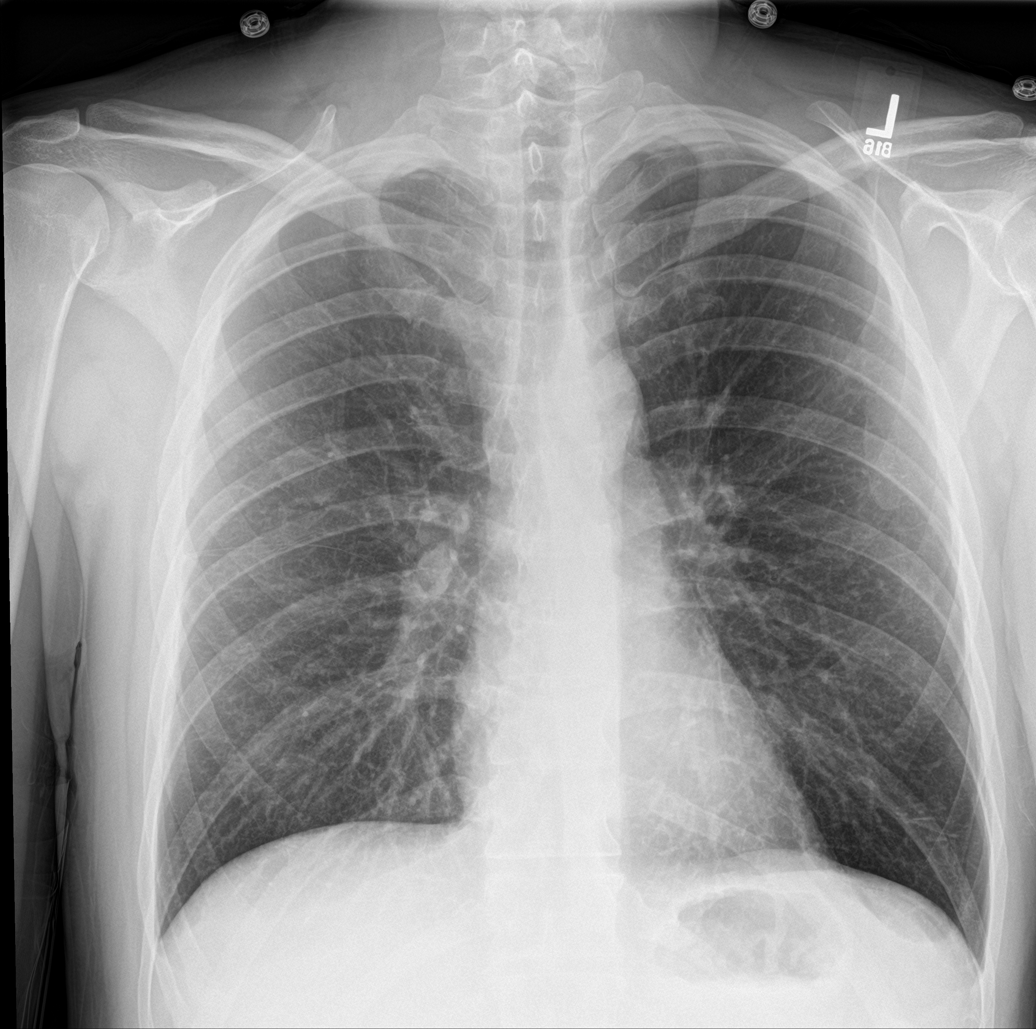

[abdomen erect]
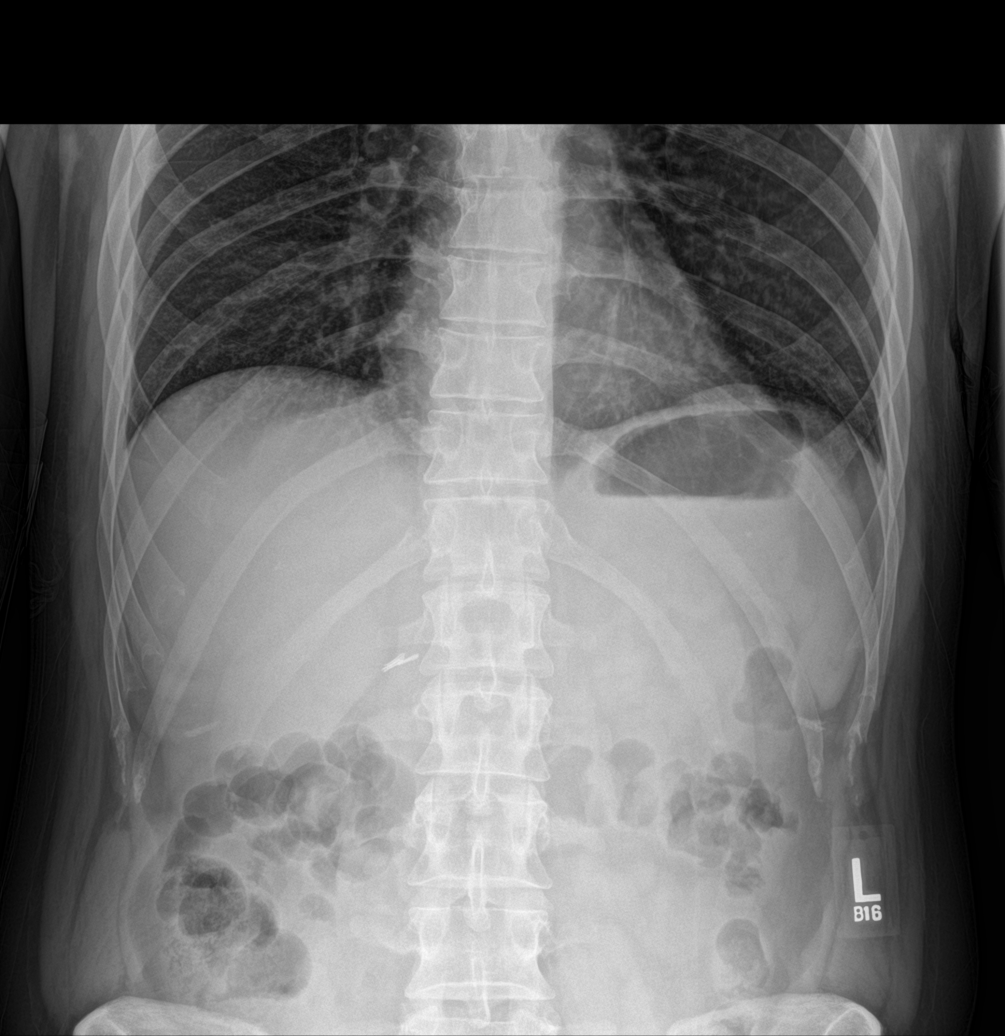

[abdomen supine (1 of 3)]
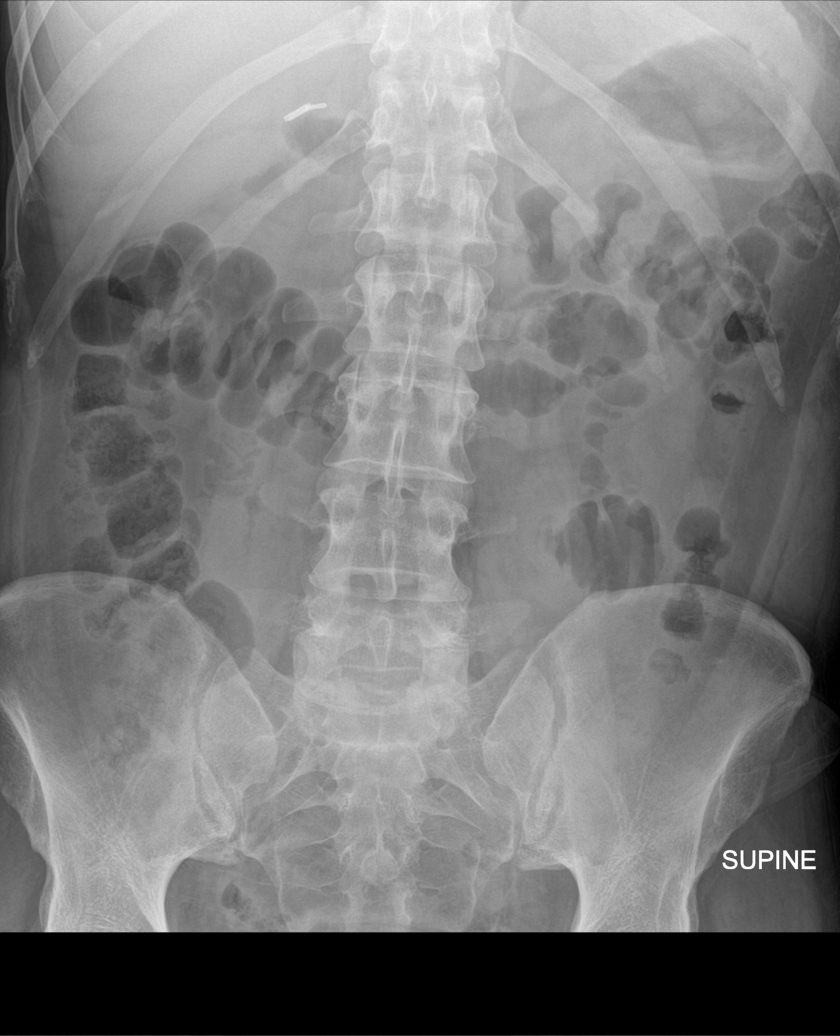

[abdomen supine (2 of 3)]
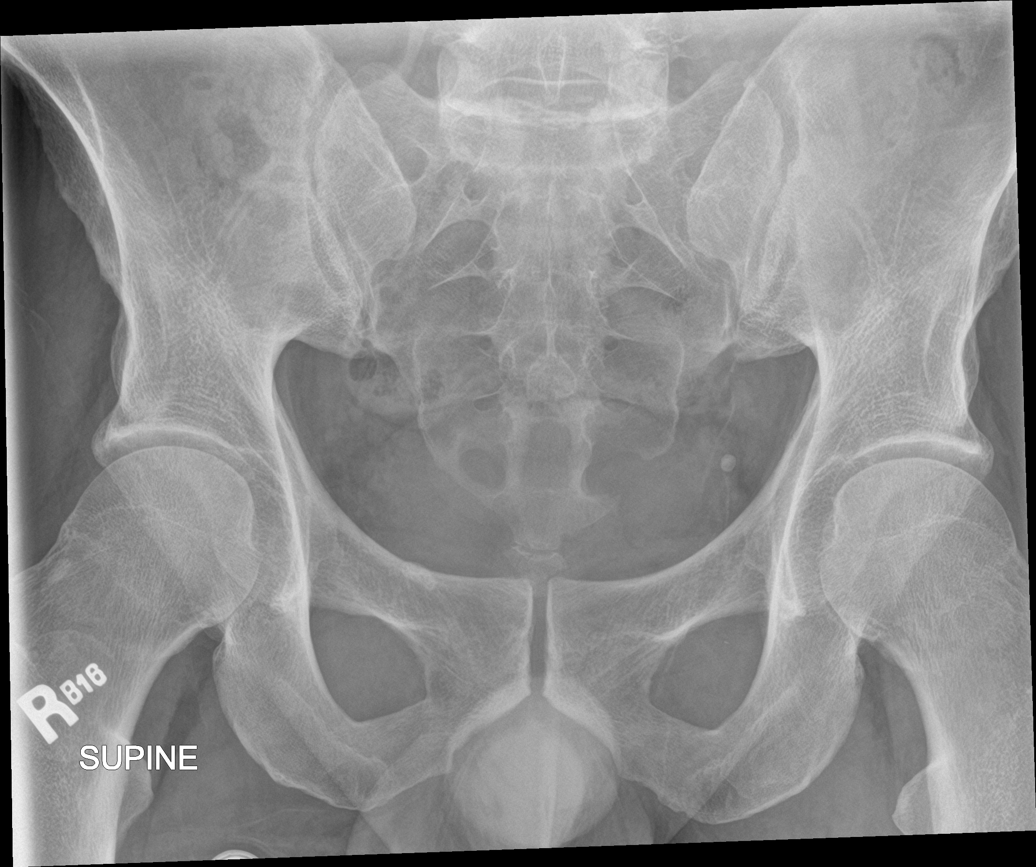

[abdomen supine (3 of 3)]
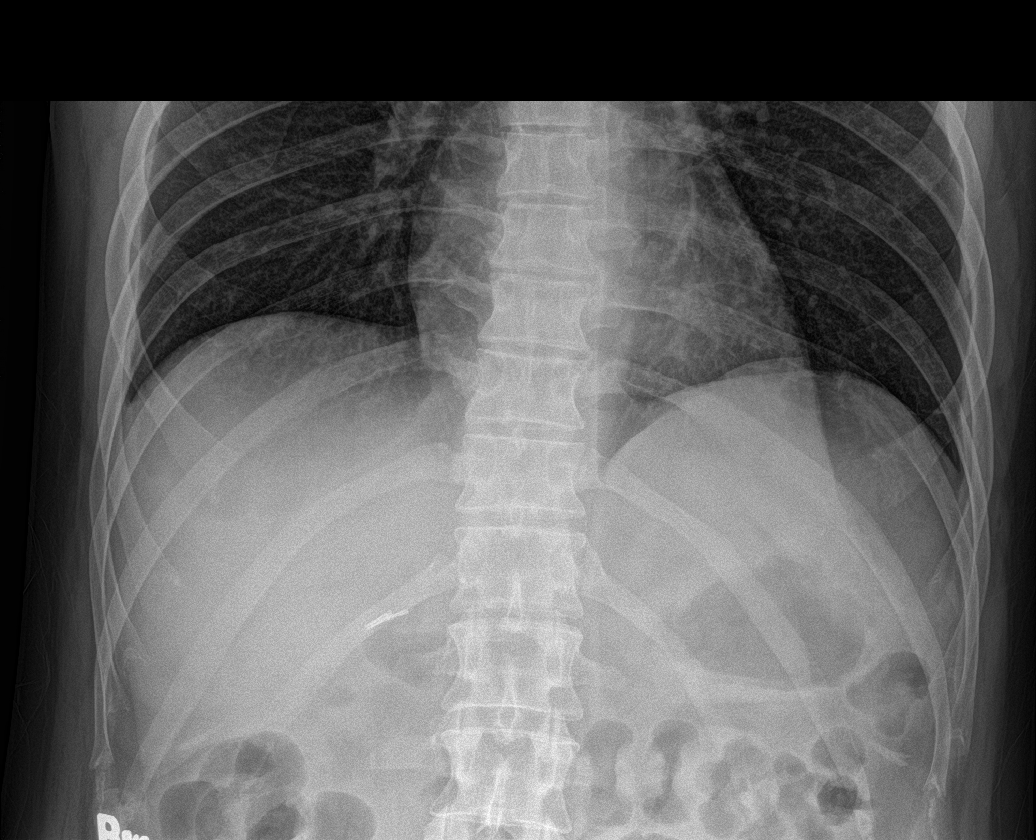

[5 of 5 positions shown; findings below may reference images not displayed]

FINDINGS: There is no evidence of dilated bowel loops or free intraperitoneal
air. Surgical clips right upper quadrant. No radiopaque calculi or
other significant radiographic abnormality is seen. Heart size and
mediastinal contours are within normal limits. Both lungs are clear.
IMPRESSION: Negative abdominal radiographs.  No acute cardiopulmonary disease.

## 2023-03-28 ENCOUNTER — Encounter (HOSPITAL_COMMUNITY)
Admission: EM | Disposition: A | Discharge status: Home / Self Care | Payer: Self-pay | Source: Home / Self Care | Attending: Emergency Medicine

## 2023-03-28 ENCOUNTER — Emergency Department (HOSPITAL_COMMUNITY): Payer: BC Managed Care – PPO | Admitting: Anesthesiology

## 2023-03-28 ENCOUNTER — Ambulatory Visit (HOSPITAL_COMMUNITY)
Admission: EM | Admit: 2023-03-28 | Discharge: 2023-03-28 | Disposition: A | Discharge status: Home / Self Care | Payer: BC Managed Care – PPO | Attending: Emergency Medicine | Admitting: Emergency Medicine

## 2023-03-28 ENCOUNTER — Other Ambulatory Visit: Payer: Self-pay

## 2023-03-28 ENCOUNTER — Encounter (HOSPITAL_COMMUNITY): Payer: Self-pay

## 2023-03-28 ENCOUNTER — Emergency Department (HOSPITAL_COMMUNITY): Payer: BC Managed Care – PPO

## 2023-03-28 DIAGNOSIS — Z23 Encounter for immunization: Secondary | ICD-10-CM | POA: Diagnosis not present

## 2023-03-28 DIAGNOSIS — K3184 Gastroparesis: Secondary | ICD-10-CM | POA: Diagnosis not present

## 2023-03-28 DIAGNOSIS — Y93H2 Activity, gardening and landscaping: Secondary | ICD-10-CM | POA: Diagnosis not present

## 2023-03-28 DIAGNOSIS — S83094A Other dislocation of right patella, initial encounter: Secondary | ICD-10-CM | POA: Diagnosis not present

## 2023-03-28 DIAGNOSIS — Z79899 Other long term (current) drug therapy: Secondary | ICD-10-CM | POA: Diagnosis not present

## 2023-03-28 DIAGNOSIS — W293XXA Contact with powered garden and outdoor hand tools and machinery, initial encounter: Secondary | ICD-10-CM | POA: Diagnosis not present

## 2023-03-28 DIAGNOSIS — F17210 Nicotine dependence, cigarettes, uncomplicated: Secondary | ICD-10-CM | POA: Diagnosis not present

## 2023-03-28 DIAGNOSIS — Y92096 Garden or yard of other non-institutional residence as the place of occurrence of the external cause: Secondary | ICD-10-CM | POA: Diagnosis not present

## 2023-03-28 DIAGNOSIS — F12188 Cannabis abuse with other cannabis-induced disorder: Secondary | ICD-10-CM | POA: Diagnosis not present

## 2023-03-28 DIAGNOSIS — S71101A Unspecified open wound, right thigh, initial encounter: Secondary | ICD-10-CM | POA: Insufficient documentation

## 2023-03-28 DIAGNOSIS — S81011A Laceration without foreign body, right knee, initial encounter: Secondary | ICD-10-CM | POA: Diagnosis present

## 2023-03-28 DIAGNOSIS — Z1152 Encounter for screening for COVID-19: Secondary | ICD-10-CM | POA: Insufficient documentation

## 2023-03-28 HISTORY — DX: Gastroparesis: K31.84

## 2023-03-28 HISTORY — PX: I & D EXTREMITY: SHX5045

## 2023-03-28 LAB — BASIC METABOLIC PANEL
Anion gap: 7 (ref 5–15)
BUN: 11 mg/dL (ref 6–20)
CO2: 24 mmol/L (ref 22–32)
Calcium: 8.3 mg/dL — ABNORMAL LOW (ref 8.9–10.3)
Chloride: 107 mmol/L (ref 98–111)
Creatinine, Ser: 1.11 mg/dL (ref 0.61–1.24)
GFR, Estimated: >60 mL/min (ref >60)
Glucose, Bld: 98 mg/dL (ref 70–99)
Potassium: 3.9 mmol/L (ref 3.5–5.1)
Sodium: 138 mmol/L (ref 135–145)

## 2023-03-28 LAB — CBC WITH DIFFERENTIAL/PLATELET
Abs Immature Granulocytes: 0.02 10*3/uL (ref 0.00–0.07)
Basophils Absolute: 0.1 10*3/uL (ref 0.0–0.1)
Basophils Relative: 1 %
Eosinophils Absolute: 0.4 10*3/uL (ref 0.0–0.5)
Eosinophils Relative: 5 %
HCT: 42.1 % (ref 39.0–52.0)
Hemoglobin: 14 g/dL (ref 13.0–17.0)
Immature Granulocytes: 0 %
Lymphocytes Relative: 35 %
Lymphs Abs: 2.5 10*3/uL (ref 0.7–4.0)
MCH: 31.3 pg (ref 26.0–34.0)
MCHC: 33.3 g/dL (ref 30.0–36.0)
MCV: 94.2 fL (ref 80.0–100.0)
Monocytes Absolute: 0.8 10*3/uL (ref 0.1–1.0)
Monocytes Relative: 11 %
Neutro Abs: 3.4 10*3/uL (ref 1.7–7.7)
Neutrophils Relative %: 48 %
Platelets: 166 10*3/uL (ref 150–400)
RBC: 4.47 MIL/uL (ref 4.22–5.81)
RDW: 13.7 % (ref 11.5–15.5)
WBC: 7.2 10*3/uL (ref 4.0–10.5)
nRBC: 0 % (ref 0.0–0.2)

## 2023-03-28 LAB — SURGICAL PCR SCREEN
MRSA, PCR: NEGATIVE
Staphylococcus aureus: NEGATIVE

## 2023-03-28 LAB — SARS CORONAVIRUS 2 BY RT PCR: SARS Coronavirus 2 by RT PCR: NEGATIVE

## 2023-03-28 SURGERY — IRRIGATION AND DEBRIDEMENT EXTREMITY
Anesthesia: General | Site: Knee | Laterality: Right

## 2023-03-28 MED ORDER — OXYCODONE HCL 5 MG PO TABS
5.0000 mg | ORAL_TABLET | Freq: Once | ORAL | Status: AC | PRN
  Administered 2023-03-28: 5 mg via ORAL

## 2023-03-28 MED ORDER — ONDANSETRON HCL 4 MG/2ML IJ SOLN: 4.0000 mg | Freq: Once | INTRAMUSCULAR | Status: DC

## 2023-03-28 MED ORDER — CHLORHEXIDINE GLUCONATE 0.12 % MT SOLN
OROMUCOSAL | Status: AC
  Administered 2023-03-28: 15 mL via OROMUCOSAL
  Filled 2023-03-28: qty 15

## 2023-03-28 MED ORDER — CHLORHEXIDINE GLUCONATE 0.12 % MT SOLN: 15.0000 mL | Freq: Once | OROMUCOSAL | Status: AC

## 2023-03-28 MED ORDER — HYDROMORPHONE HCL 1 MG/ML IJ SOLN
INTRAMUSCULAR | Status: AC
  Filled 2023-03-28: qty 0.5

## 2023-03-28 MED ORDER — ACETAMINOPHEN 10 MG/ML IV SOLN
INTRAVENOUS | Status: AC
  Filled 2023-03-28: qty 100

## 2023-03-28 MED ORDER — POVIDONE-IODINE 10 % EX SWAB
2.0000 | Freq: Once | CUTANEOUS | Status: AC
  Administered 2023-03-28: 2 via TOPICAL

## 2023-03-28 MED ORDER — ROCURONIUM BROMIDE 10 MG/ML (PF) SYRINGE
PREFILLED_SYRINGE | INTRAVENOUS | Status: AC
  Filled 2023-03-28: qty 10

## 2023-03-28 MED ORDER — CEFAZOLIN SODIUM-DEXTROSE 2-4 GM/100ML-% IV SOLN
2.0000 g | Freq: Once | INTRAVENOUS | Status: AC
  Administered 2023-03-28: 2 g via INTRAVENOUS
  Filled 2023-03-28: qty 100

## 2023-03-28 MED ORDER — OXYCODONE HCL 5 MG PO TABS: 5.0000 mg | ORAL_TABLET | ORAL | 0 refills | Status: AC | PRN

## 2023-03-28 MED ORDER — DEXMEDETOMIDINE HCL IN NACL 80 MCG/20ML IV SOLN
INTRAVENOUS | Status: DC | PRN
  Administered 2023-03-28 (×2): 8 ug via INTRAVENOUS

## 2023-03-28 MED ORDER — CHLORHEXIDINE GLUCONATE 4 % EX SOLN: 60.0000 mL | Freq: Once | CUTANEOUS | Status: DC

## 2023-03-28 MED ORDER — FENTANYL CITRATE (PF) 250 MCG/5ML IJ SOLN
INTRAMUSCULAR | Status: AC
  Filled 2023-03-28: qty 5

## 2023-03-28 MED ORDER — PROPOFOL 10 MG/ML IV BOLUS
INTRAVENOUS | Status: DC | PRN
  Administered 2023-03-28: 200 mg via INTRAVENOUS
  Administered 2023-03-28: 100 mg via INTRAVENOUS

## 2023-03-28 MED ORDER — OXYCODONE HCL 5 MG PO TABS
10.0000 mg | ORAL_TABLET | ORAL | Status: DC | PRN
  Administered 2023-03-28: 10 mg via ORAL
  Filled 2023-03-28: qty 2

## 2023-03-28 MED ORDER — ONDANSETRON HCL 4 MG/2ML IJ SOLN
INTRAMUSCULAR | Status: AC
  Filled 2023-03-28: qty 2

## 2023-03-28 MED ORDER — ALBUTEROL SULFATE HFA 108 (90 BASE) MCG/ACT IN AERS
INHALATION_SPRAY | RESPIRATORY_TRACT | Status: DC | PRN
  Administered 2023-03-28: 4 via RESPIRATORY_TRACT

## 2023-03-28 MED ORDER — OXYCODONE HCL 5 MG PO TABS
ORAL_TABLET | ORAL | Status: AC
  Filled 2023-03-28: qty 1

## 2023-03-28 MED ORDER — MORPHINE SULFATE (PF) 4 MG/ML IV SOLN
4.0000 mg | Freq: Once | INTRAVENOUS | Status: AC
  Administered 2023-03-28: 4 mg via INTRAVENOUS
  Filled 2023-03-28: qty 1

## 2023-03-28 MED ORDER — DOXYCYCLINE HYCLATE 100 MG PO TABS: 100.0000 mg | ORAL_TABLET | Freq: Two times a day (BID) | ORAL | 0 refills | Status: AC

## 2023-03-28 MED ORDER — 0.9 % SODIUM CHLORIDE (POUR BTL) OPTIME
TOPICAL | Status: DC | PRN
  Administered 2023-03-28: 1000 mL

## 2023-03-28 MED ORDER — TETANUS-DIPHTH-ACELL PERTUSSIS 5-2.5-18.5 LF-MCG/0.5 IM SUSY
PREFILLED_SYRINGE | INTRAMUSCULAR | Status: AC
  Filled 2023-03-28: qty 0.5

## 2023-03-28 MED ORDER — PROPOFOL 10 MG/ML IV BOLUS
INTRAVENOUS | Status: AC
  Filled 2023-03-28: qty 20

## 2023-03-28 MED ORDER — OXYCODONE HCL 5 MG/5ML PO SOLN: 5.0000 mg | Freq: Once | ORAL | Status: AC | PRN

## 2023-03-28 MED ORDER — ONDANSETRON HCL 4 MG/2ML IJ SOLN
INTRAMUSCULAR | Status: DC | PRN
  Administered 2023-03-28: 4 mg via INTRAVENOUS

## 2023-03-28 MED ORDER — FENTANYL CITRATE (PF) 250 MCG/5ML IJ SOLN
INTRAMUSCULAR | Status: DC | PRN
  Administered 2023-03-28: 25 ug via INTRAVENOUS
  Administered 2023-03-28: 100 ug via INTRAVENOUS
  Administered 2023-03-28: 75 ug via INTRAVENOUS
  Administered 2023-03-28: 50 ug via INTRAVENOUS

## 2023-03-28 MED ORDER — TETANUS-DIPHTH-ACELL PERTUSSIS 5-2.5-18.5 LF-MCG/0.5 IM SUSY
0.5000 mL | PREFILLED_SYRINGE | Freq: Once | INTRAMUSCULAR | Status: AC
  Administered 2023-03-28: 0.5 mL via INTRAMUSCULAR
  Filled 2023-03-28: qty 0.5

## 2023-03-28 MED ORDER — ORAL CARE MOUTH RINSE: 15.0000 mL | Freq: Once | OROMUCOSAL | Status: AC

## 2023-03-28 MED ORDER — KETOROLAC TROMETHAMINE 30 MG/ML IJ SOLN
INTRAMUSCULAR | Status: AC
  Filled 2023-03-28: qty 1

## 2023-03-28 MED ORDER — MIDAZOLAM HCL 2 MG/2ML IJ SOLN
INTRAMUSCULAR | Status: AC
  Filled 2023-03-28: qty 2

## 2023-03-28 MED ORDER — MIDAZOLAM HCL 2 MG/2ML IJ SOLN
INTRAMUSCULAR | Status: DC | PRN
  Administered 2023-03-28: 2 mg via INTRAVENOUS

## 2023-03-28 MED ORDER — SUCCINYLCHOLINE CHLORIDE 200 MG/10ML IV SOSY
PREFILLED_SYRINGE | INTRAVENOUS | Status: AC
  Filled 2023-03-28: qty 10

## 2023-03-28 MED ORDER — DEXAMETHASONE SODIUM PHOSPHATE 10 MG/ML IJ SOLN
INTRAMUSCULAR | Status: AC
  Filled 2023-03-28: qty 1

## 2023-03-28 MED ORDER — CEFAZOLIN SODIUM 1 G IJ SOLR
INTRAMUSCULAR | Status: AC
  Filled 2023-03-28: qty 20

## 2023-03-28 MED ORDER — SUCCINYLCHOLINE 20MG/ML (10ML) SYRINGE FOR MEDFUSION PUMP - OPTIME
INTRAMUSCULAR | Status: DC | PRN
  Administered 2023-03-28: 140 mg via INTRAVENOUS

## 2023-03-28 MED ORDER — HYDROMORPHONE HCL 1 MG/ML IJ SOLN
INTRAMUSCULAR | Status: DC | PRN
  Administered 2023-03-28: .25 mg via INTRAVENOUS

## 2023-03-28 MED ORDER — ACETAMINOPHEN 10 MG/ML IV SOLN: 1000.0000 mg | Freq: Once | INTRAVENOUS | Status: DC | PRN

## 2023-03-28 MED ORDER — LIDOCAINE 2% (20 MG/ML) 5 ML SYRINGE
INTRAMUSCULAR | Status: AC
  Filled 2023-03-28: qty 5

## 2023-03-28 MED ORDER — FENTANYL CITRATE (PF) 100 MCG/2ML IJ SOLN
INTRAMUSCULAR | Status: AC
  Filled 2023-03-28: qty 2

## 2023-03-28 MED ORDER — KETOROLAC TROMETHAMINE 15 MG/ML IJ SOLN
INTRAMUSCULAR | Status: DC | PRN
  Administered 2023-03-28: 30 mg via INTRAVENOUS

## 2023-03-28 MED ORDER — CEFAZOLIN SODIUM-DEXTROSE 2-4 GM/100ML-% IV SOLN
2.0000 g | INTRAVENOUS | Status: AC
  Administered 2023-03-28: 2 g via INTRAVENOUS

## 2023-03-28 MED ORDER — LACTATED RINGERS IV SOLN: INTRAVENOUS | Status: DC

## 2023-03-28 MED ORDER — DEXAMETHASONE SODIUM PHOSPHATE 10 MG/ML IJ SOLN
INTRAMUSCULAR | Status: DC | PRN
  Administered 2023-03-28: 5 mg via INTRAVENOUS

## 2023-03-28 MED ORDER — STERILE WATER FOR IRRIGATION IR SOLN
Status: DC | PRN
  Administered 2023-03-28: 500 mL

## 2023-03-28 MED ORDER — FENTANYL CITRATE (PF) 100 MCG/2ML IJ SOLN
25.0000 ug | INTRAMUSCULAR | Status: DC | PRN
  Administered 2023-03-28: 50 ug via INTRAVENOUS

## 2023-03-28 MED ORDER — LIDOCAINE 2% (20 MG/ML) 5 ML SYRINGE
INTRAMUSCULAR | Status: DC | PRN
  Administered 2023-03-28: 100 mg via INTRAVENOUS

## 2023-03-28 MED ORDER — ONDANSETRON HCL 4 MG/2ML IJ SOLN: 4.0000 mg | Freq: Once | INTRAMUSCULAR | Status: DC | PRN

## 2023-03-28 MED ORDER — ACETAMINOPHEN 10 MG/ML IV SOLN
INTRAVENOUS | Status: DC | PRN
  Administered 2023-03-28: 1000 mg via INTRAVENOUS

## 2023-03-28 MED ORDER — SODIUM CHLORIDE 0.9 % IV BOLUS
1000.0000 mL | Freq: Once | INTRAVENOUS | Status: AC
  Administered 2023-03-28: 1000 mL via INTRAVENOUS

## 2023-03-28 SURGICAL SUPPLY — 65 items
BAG COUNTER SPONGE SURGICOUNT (BAG) IMPLANT
BAG SPNG CNTER NS LX DISP (BAG)
BANDAGE ESMARK 6X9 LF (GAUZE/BANDAGES/DRESSINGS) IMPLANT
BNDG CMPR 5X62 HK CLSR LF (GAUZE/BANDAGES/DRESSINGS) ×2
BNDG CMPR 6 X 5 YARDS HK CLSR (GAUZE/BANDAGES/DRESSINGS) ×2
BNDG CMPR 6"X 5 YARDS HK CLSR (GAUZE/BANDAGES/DRESSINGS) ×2
BNDG CMPR 9X4 STRL LF SNTH (GAUZE/BANDAGES/DRESSINGS)
BNDG CMPR 9X6 STRL LF SNTH (GAUZE/BANDAGES/DRESSINGS)
BNDG CMPR MED 10X6 ELC LF (GAUZE/BANDAGES/DRESSINGS) ×1
BNDG COHESIVE 4X5 TAN STRL (GAUZE/BANDAGES/DRESSINGS) IMPLANT
BNDG ELASTIC 6INX 5YD STR LF (GAUZE/BANDAGES/DRESSINGS) IMPLANT
BNDG ELASTIC 6X10 VLCR STRL LF (GAUZE/BANDAGES/DRESSINGS) ×1 IMPLANT
BNDG ESMARK 4X9 LF (GAUZE/BANDAGES/DRESSINGS) IMPLANT
BNDG ESMARK 6X9 LF (GAUZE/BANDAGES/DRESSINGS)
BNDG GAUZE DERMACEA FLUFF 4 (GAUZE/BANDAGES/DRESSINGS) ×1 IMPLANT
BNDG GZE DERMACEA 4 6PLY (GAUZE/BANDAGES/DRESSINGS)
COVER SURGICAL LIGHT HANDLE (MISCELLANEOUS) ×2 IMPLANT
CUFF TOURN SGL QUICK 34 (TOURNIQUET CUFF)
CUFF TRNQT CYL 34X4.125X (TOURNIQUET CUFF) ×1 IMPLANT
DRAPE U-SHAPE 47X51 STRL (DRAPES) ×1 IMPLANT
DRSG XEROFORM 1X8 (GAUZE/BANDAGES/DRESSINGS) IMPLANT
DURAPREP 26ML APPLICATOR (WOUND CARE) ×1 IMPLANT
ELECT REM PT RETURN 9FT ADLT (ELECTROSURGICAL) ×1
ELECTRODE REM PT RTRN 9FT ADLT (ELECTROSURGICAL) ×1 IMPLANT
GAUZE PAD ABD 7.5X8 STRL (GAUZE/BANDAGES/DRESSINGS) IMPLANT
GAUZE PAD ABD 8X10 STRL (GAUZE/BANDAGES/DRESSINGS) IMPLANT
GAUZE SPONGE 4X4 12PLY STRL (GAUZE/BANDAGES/DRESSINGS) IMPLANT
GAUZE SPONGE 4X4 12PLY STRL LF (GAUZE/BANDAGES/DRESSINGS) ×1 IMPLANT
GAUZE XEROFORM 1X8 LF (GAUZE/BANDAGES/DRESSINGS) ×1 IMPLANT
GLOVE BIOGEL M STRL SZ7.5 (GLOVE) ×1 IMPLANT
GLOVE BIOGEL PI IND STRL 7.0 (GLOVE) IMPLANT
GLOVE BIOGEL PI IND STRL 8 (GLOVE) IMPLANT
GLOVE INDICATOR 8.0 STRL GRN (GLOVE) ×1 IMPLANT
GLOVE SRG 8 PF TXTR STRL LF DI (GLOVE) ×1 IMPLANT
GLOVE SURG SS PI 6.5 STRL IVOR (GLOVE) IMPLANT
GLOVE SURG UNDER POLY LF SZ8 (GLOVE)
GOWN STRL REUS W/ TWL LRG LVL3 (GOWN DISPOSABLE) ×1 IMPLANT
GOWN STRL REUS W/ TWL XL LVL3 (GOWN DISPOSABLE) ×1 IMPLANT
GOWN STRL REUS W/TWL LRG LVL3 (GOWN DISPOSABLE) ×1
GOWN STRL REUS W/TWL XL LVL3 (GOWN DISPOSABLE) ×3
HANDPIECE INTERPULSE COAX TIP (DISPOSABLE) ×1
IMMOBILIZER KNEE 22 UNIV (SOFTGOODS) IMPLANT
KIT BASIN OR (CUSTOM PROCEDURE TRAY) ×1 IMPLANT
KIT TURNOVER KIT B (KITS) ×1 IMPLANT
MANIFOLD NEPTUNE II (INSTRUMENTS) ×1 IMPLANT
NDL 22X1.5 STRL (OR ONLY) (MISCELLANEOUS) IMPLANT
NEEDLE 22X1.5 STRL (OR ONLY) (MISCELLANEOUS) IMPLANT
NS IRRIG 1000ML POUR BTL (IV SOLUTION) ×1 IMPLANT
PACK ORTHO EXTREMITY (CUSTOM PROCEDURE TRAY) ×1 IMPLANT
PAD ARMBOARD 7.5X6 YLW CONV (MISCELLANEOUS) ×2 IMPLANT
PAD CAST 4YDX4 CTTN HI CHSV (CAST SUPPLIES) ×1 IMPLANT
PADDING CAST ABS COTTON 4X4 ST (CAST SUPPLIES) IMPLANT
PADDING CAST COTTON 4X4 STRL (CAST SUPPLIES)
SET CYSTO W/LG BORE CLAMP LF (SET/KITS/TRAYS/PACK) IMPLANT
SET HNDPC FAN SPRY TIP SCT (DISPOSABLE) ×1 IMPLANT
SPONGE T-LAP 18X18 ~~LOC~~+RFID (SPONGE) IMPLANT
STOCKINETTE IMPERVIOUS 9X36 MD (GAUZE/BANDAGES/DRESSINGS) IMPLANT
SUT ETHILON 2 0 FS 18 (SUTURE) IMPLANT
SUT ETHILON 3 0 FSL (SUTURE) IMPLANT
SUT MON AB 2-0 CT1 36 (SUTURE) IMPLANT
SUT VIC AB 2-0 CT1 27 (SUTURE) ×2
SUT VIC AB 2-0 CT1 TAPERPNT 27 (SUTURE) IMPLANT
TUBE CONNECTING 12X1/4 (SUCTIONS) ×1 IMPLANT
UNDERPAD 30X36 HEAVY ABSORB (UNDERPADS AND DIAPERS) ×1 IMPLANT
YANKAUER SUCT BULB TIP NO VENT (SUCTIONS) ×1 IMPLANT

## 2023-03-28 NOTE — TOC CAGE-AID Note (Signed)
Transition of Care [facility]) - CAGE-AID Screening   Patient Details  Name: Steven Black MRN: [id number] Date of Birth: 29-Jan-1976  Transition of [facility]) CM/SW Contact:    Janora Norlander, RN Phone Number: [phone] 03/28/2023, 5:23 PM   Clinical Narrative: Pt here after sustaining a significant lac to R knee due to a chainsaw accident.  Pt denies alcohol use but states that he does use marijuana.  Pt states that he does not need resources.  Screening complete.   CAGE-AID Screening:    Have You Ever Felt You Ought to Cut Down on Your Drinking or Drug Use?: No Have People Annoyed You By Critizing Your Drinking Or Drug Use?: No Have You Felt Bad Or Guilty About Your Drinking Or Drug Use?: No Have You Ever Had a Drink or Used Drugs First Thing In The Morning to Steady Your Nerves or to Get Rid of a Hangover?: No CAGE-AID Score: 0  Substance Abuse Education Offered: No

## 2023-03-28 NOTE — ED Triage Notes (Signed)
Pt bib ems from home; was trimming trees, went to set chainsaw down and lacerated L knee; 9 cm x 3 cm lac present; bleeding controlled on ems arrival; pt a and o x 4, diaphoretic; 1L NS, 4 zofran, 100 mcg fentanyl given pta; 18 ga LAC

## 2023-03-28 NOTE — ED Notes (Signed)
Ortho PA in to see patient

## 2023-03-28 NOTE — Progress Notes (Signed)
   03/28/23 1413  Spiritual Encounters  Type of Visit Initial  Care provided to: Patient  Referral source Trauma page  Reason for visit Trauma  OnCall Visit No   Chaplain arrived to find medical team at work and Pt unavailable.  When I was able to speak to the Pt he informed me that his wife was on the way.  Chaplain went to check waiting area for wife but did not find her.  Chaplain instructed security to send her back to the Pt room whenever she arrived.  Chaplain returned to speak with Pt who was in a good deal of pain.  Chaplain instructed Pt on how to reach out to Spiritual Care if he needed/wanted to talk. Chaplain services remain available via paging Spiritual Care.

## 2023-03-28 NOTE — Anesthesia Preprocedure Evaluation (Signed)
Anesthesia Evaluation  Patient identified by MRN, date of birth, ID band Patient awake    Reviewed: Allergy & Precautions, H&P , NPO status , Patient's Chart, lab work & pertinent test results  Airway Mallampati: II  TM Distance: >3 FB Neck ROM: Full    Dental no notable dental hx.    Pulmonary neg pulmonary ROS, Current Smoker   Pulmonary exam normal breath sounds clear to auscultation       Cardiovascular negative cardio ROS Normal cardiovascular exam Rhythm:Regular Rate:Normal     Neuro/Psych negative neurological ROS  negative psych ROS   GI/Hepatic Neg liver ROS,,,Gastroparesis NPO since 1130   Endo/Other  negative endocrine ROS    Renal/GU negative Renal ROS  negative genitourinary   Musculoskeletal negative musculoskeletal ROS (+)    Abdominal   Peds negative pediatric ROS (+)  Hematology negative hematology ROS (+)   Anesthesia Other Findings   Reproductive/Obstetrics negative OB ROS                             Anesthesia Physical Anesthesia Plan  ASA: 2  Anesthesia Plan: General   Post-op Pain Management: Toradol IV (intra-op)*   Induction: Intravenous  PONV Risk Score and Plan: 1 and Ondansetron and Dexamethasone  Airway Management Planned: Oral ETT  Additional Equipment:   Intra-op Plan:   Post-operative Plan: Extubation in OR  Informed Consent: I have reviewed the patients History and Physical, chart, labs and discussed the procedure including the risks, benefits and alternatives for the proposed anesthesia with the patient or authorized representative who has indicated his/her understanding and acceptance.     Dental advisory given  Plan Discussed with: CRNA and Surgeon  Anesthesia Plan Comments:         Anesthesia Quick Evaluation

## 2023-03-28 NOTE — Anesthesia Procedure Notes (Signed)
Procedure Name: Intubation Date/Time: 03/28/2023 7:35 PM  Performed by: Aundria Rud, CRNAPre-anesthesia Checklist: Patient identified, Emergency Drugs available, Suction available and Patient being monitored Patient Re-evaluated:Patient Re-evaluated prior to induction Oxygen Delivery Method: Circle System Utilized Preoxygenation: Pre-oxygenation with 100% oxygen Induction Type: IV induction, Rapid sequence and Cricoid Pressure applied Laryngoscope Size: Mac and 4 Grade View: Grade I Tube type: Oral Tube size: 7.5 mm Number of attempts: 1 Airway Equipment and Method: Stylet and Oral airway Placement Confirmation: ETT inserted through vocal cords under direct vision, positive ETCO2 and breath sounds checked- equal and bilateral Secured at: 21 cm Tube secured with: Tape Dental Injury: Teeth and Oropharynx as per pre-operative assessment

## 2023-03-28 NOTE — Discharge Instructions (Signed)
DR. Susa Simmonds SURGERY POST-OP INSTRUCTIONS   Pain Management Keep your leg elevated above heart level.  Make sure that your heel hangs free ('floats'). Take all prescribed medication as directed. If taking narcotic pain medication you may want to use an over-the-counter stool softener to avoid constipation. You may take over-the-counter NSAIDs (ibuprofen, naproxen, etc.) as well as over-the-counter acetaminophen as directed on the packaging as a supplement for your pain and may also use it to wean away from the prescription medication.  Activity Weightbearing as tolerated in knee immobilizer  Keep dressing clean, dry, and intact  First Postoperative Visit Your first postop visit will be at least 2 weeks after surgery.  This should be scheduled when you schedule surgery. If you do not have a postoperative visit scheduled please call [phone] to schedule an appointment. At the appointment your incision will be evaluated for suture removal, x-rays will be obtained if necessary.  General Instructions DO NOT change the dressing.  If there is a problem with the dressing (too tight, loose, gets wet, etc.) please contact Dr. Donnie Mesa office. DO NOT get the dressing wet.  For showers you can use an over-the-counter cast cover or wrap a washcloth around the top of your dressing and then cover it with a plastic bag and tape it to your leg. DO NOT soak the incision (no tubs, pools, bath, etc.) until you have approval from Dr. Susa Simmonds.  Contact Dr. Garret Reddish office or go to Emergency Room if: Temperature above 101 F. Increasing pain that is unresponsive to pain medication or elevation Excessive redness or swelling in your leg Dressing problems - excessive bloody drainage, looseness or tightness, or if dressing gets wet Develop pain, swelling, warmth, or discoloration of your calf

## 2023-03-28 NOTE — ED Provider Notes (Signed)
EMERGENCY DEPARTMENT AT [facility] Provider Note   CSN: [id number] Arrival date & time: 03/28/23  1322     History  Chief Complaint  Patient presents with   Extremity Laceration    Steven Black is a 47 y.o. male.  Pt is a 47 yo male with no significant pmhx.  Pt said he was using a chainsaw at home to trim a tree.  He set the chainsaw down.  It kicked back and cut him on his knee.  He was able to walk back to his house.  Pt called EMS.  Pt was very diaphoretic en route.  He was given 1L NS and 4 mg zofran IV and 100 mcg fentanyl.  He still has a lot of pain.  He denies any other injury.       Home Medications Prior to Admission medications   Medication Sig Start Date End Date Taking? Authorizing Provider  benzonatate (TESSALON) 100 MG capsule Take 2 capsules (200 mg total) by mouth every 8 (eight) hours. 12/07/18   Khatri, Hina, PA-C  fluticasone (FLONASE) 50 MCG/ACT nasal spray Place 1 spray into both nostrils daily. 12/07/18   Khatri, Hina, PA-C  LORazepam (ATIVAN) 1 MG tablet Take 1 tablet (1 mg total) by mouth 3 (three) times daily as needed for anxiety (or vertigo). Patient not taking: Reported on 12/07/2018 07/20/18   Dione Booze, MD  meclizine (ANTIVERT) 25 MG tablet Take 1 tablet (25 mg total) by mouth 3 (three) times daily as needed for dizziness. Patient not taking: Reported on 12/07/2018 07/20/18   Dione Booze, MD  metoCLOPramide (REGLAN) 10 MG tablet Take 1 tablet (10 mg total) by mouth every 6 (six) hours as needed for nausea (or headache). Patient not taking: Reported on 12/07/2018 07/20/18   Dione Booze, MD  ondansetron (ZOFRAN) 4 MG tablet Take 1 tablet (4 mg total) by mouth every 6 (six) hours. 02/28/19   Ronnie Doss A, PA-C  ondansetron (ZOFRAN-ODT) 4 MG disintegrating tablet Take 4 mg by mouth every 8 (eight) hours as needed for nausea or vomiting.    [provider]  prochlorperazine (COMPAZINE) 10 MG tablet Take 1 tablet (10 mg  total) by mouth 2 (two) times daily as needed for nausea or vomiting. Patient not taking: Reported on 12/07/2018 11/04/18   Raeford Razor, MD  promethazine (PHENERGAN) 25 MG tablet Take 1 tablet (25 mg total) by mouth every 6 (six) hours as needed for nausea or vomiting. Patient not taking: Reported on 12/07/2018 10/31/18   Charlestine Night, PA-C  senna-docusate (SENOKOT-S) 8.6-50 MG tablet Take 1 tablet by mouth 2 (two) times daily. Patient not taking: Reported on 12/07/2018 07/25/18   Gwenyth Bender, NP      Allergies    Patient has no known allergies.    Review of Systems   Review of Systems  Skin:  Positive for wound.  All other systems reviewed and are negative.   Physical Exam Updated Vital Signs BP (!) 142/82   Pulse 65   Temp 98.1 F (36.7 C) (Oral)   Resp 17   Ht 6\' 7"  (2.007 m)   Wt 90.7 kg   SpO2 97%   BMI 22.53 kg/m  Physical Exam Vitals and nursing note reviewed.  Constitutional:      General: He is in acute distress.     Appearance: He is diaphoretic.  HENT:     Head: Normocephalic and atraumatic.     Right Ear: External ear normal.  Left Ear: External ear normal.     Nose: Nose normal.     Mouth/Throat:     Mouth: Mucous membranes are moist.     Pharynx: Oropharynx is clear.  Eyes:     Extraocular Movements: Extraocular movements intact.     Conjunctiva/sclera: Conjunctivae normal.     Pupils: Pupils are equal, round, and reactive to light.  Cardiovascular:     Rate and Rhythm: Normal rate and regular rhythm.     Pulses: Normal pulses.     Heart sounds: Normal heart sounds.  Pulmonary:     Effort: Pulmonary effort is normal.  Abdominal:     General: Abdomen is flat. Bowel sounds are normal.     Palpations: Abdomen is soft.  Musculoskeletal:     Cervical back: Normal range of motion and neck supple.     Comments: Pain with rom of right knee  Skin:    Capillary Refill: Capillary refill takes less than 2 seconds.     Comments: See pictures.  9  cm by 3 cm lac to right knee  Neurological:     General: No focal deficit present.     Mental Status: He is oriented to person, place, and time.  Psychiatric:        Mood and Affect: Mood normal.        Behavior: Behavior normal.        ED Results / Procedures / Treatments   Labs (all labs ordered are listed, but only abnormal results are displayed) Labs Reviewed  CBC WITH DIFFERENTIAL/PLATELET  BASIC METABOLIC PANEL    EKG None  Radiology No results found.  Procedures Procedures    Medications Ordered in ED Medications  ceFAZolin (ANCEF) IVPB 2g/100 mL premix (2 g Intravenous New Bag/Given 03/28/23 1341)  morphine (PF) 4 MG/ML injection 4 mg (4 mg Intravenous Given 03/28/23 1339)  sodium chloride 0.9 % bolus 1,000 mL (1,000 mLs Intravenous New Bag/Given 03/28/23 1339)  Tdap (BOOSTRIX) injection 0.5 mL (0.5 mLs Intramuscular Given 03/28/23 1341)    ED Course/ Medical Decision Making/ A&P                             Medical Decision Making Amount and/or Complexity of Data Reviewed Labs: ordered. Radiology: ordered.  Risk Prescription drug management. Decision regarding hospitalization.   This patient presents to the ED for concern of chainsaw accident, this involves an extensive number of treatment options, and is a complaint that carries with it a high risk of complications and morbidity.  The differential diagnosis includes open fx vs lac into joint   Co morbidities that complicate the patient evaluation  none   Additional history obtained:  Additional history obtained from epic chart review External records from outside source obtained and reviewed including EMS report   Lab Tests:  I Ordered, and personally interpreted labs.  The pertinent results include:  cbc nl   Imaging Studies ordered:  I ordered imaging studies including R knee  I independently visualized and interpreted imaging which showed  . No acute fracture.  2. Mild-to-moderate  amount of air within the suprapatellar joint  space, presumably from an open wound/connection of the reported  known laceration to the joint space. Recommend clinical correlation.   I agree with the radiologist interpretation   Cardiac Monitoring:  The patient was maintained on a cardiac monitor.  I personally viewed and interpreted the cardiac monitored which showed an underlying rhythm  of: nsr   Medicines ordered and prescription drug management:  I ordered medication including ivfs, morphine, ancef  for sx  Reevaluation of the patient after these medicines showed that the patient improved I have reviewed the patients home medicines and have made adjustments as needed   Test Considered:  xr   Critical Interventions:  Level 2 trauma   Consultations Obtained:  I requested consultation with the orthopedists,  and discussed lab and imaging findings as well as pertinent plan - they recommend: OR wash out   Problem List / ED Course:  Knee lac with open joint:  ortho to take to OR   Reevaluation:  After the interventions noted above, I reevaluated the patient and found that they have :improved   Social Determinants of Health:  Lives at home   Dispostion:  After consideration of the diagnostic results and the patients response to treatment, I feel that the patent would benefit from to or for wash out.          Final Clinical Impression(s) / ED Diagnoses Final diagnoses:  Laceration of right knee, initial encounter    Rx / DC Orders ED Discharge Orders     None         Jacalyn Lefevre, MD 03/29/23 [phone]

## 2023-03-28 NOTE — Transfer of Care (Signed)
Immediate Anesthesia Transfer of Care Note  Patient: Steven Black  Procedure(s) Performed: IRRIGATION AND DEBRIDEMENT OF KNEE (Right)  Patient Location: PACU  Anesthesia Type:General  Level of Consciousness: drowsy and responds to stimulation  Airway & Oxygen Therapy: Patient Spontanous Breathing and Patient connected to face mask oxygen  Post-op Assessment: Report given to RN, Post -op Vital signs reviewed and stable, and Patient moving all extremities X 4  Post vital signs: Reviewed and stable  Last Vitals:  Vitals Value Taken Time  BP 86/48 03/28/23 2046  Temp    Pulse 73 03/28/23 2048  Resp 21 03/28/23 2048  SpO2 95 % 03/28/23 2048  Vitals shown include unfiled device data.  Last Pain:  Vitals:   03/28/23 1850  TempSrc:   PainSc: 5          Complications: No notable events documented.

## 2023-03-28 NOTE — Consult Note (Signed)
Reason for Consult: Right knee laceration by chainsaw Referring Physician: Charma Igo, PA-C ; Emergency department  Steven Black is an 47 y.o. male.  HPI: Steven Black was cutting a tree earlier this afternoon when he accidentally struck the medial aspect of his right knee with a chainsaw.  He suffered a significant laceration.  He was able to walk thereafter.  He was brought to the emergency department and orthopedics was consulted due to nature of the wound and potential communication with the knee joint.  Pain has improved with pain medicines.  Pain is isolated to the right knee.  No numbness or tingling. Reports last PO intake at 11:30 am today   Past Medical History:  Diagnosis Date   Cannabis hyperemesis syndrome concurrent with and due to cannabis abuse (HCC)    Emesis    Gastroparesis    Mega cisterna magna (HCC)    07/24/18 incidental finding    Past Surgical History:  Procedure Laterality Date   CHOLECYSTECTOMY      History reviewed. No pertinent family history.  Social History:  reports that he has been smoking cigarettes. He has never used smokeless tobacco. He reports current drug use. Drug: Marijuana. He reports that he does not drink alcohol.  Allergies: No Known Allergies  Medications: I have reviewed the patient's current medications.  Results for orders placed or performed during the hospital encounter of 03/28/23 (from the past 48 hour(s))  Basic metabolic panel     Status: Abnormal   Collection Time: 03/28/23  1:26 PM  Result Value Ref Range   Sodium 138 135 - 145 mmol/L   Potassium 3.9 3.5 - 5.1 mmol/L    Comment: HEMOLYSIS AT THIS LEVEL MAY AFFECT RESULT   Chloride 107 98 - 111 mmol/L   CO2 24 22 - 32 mmol/L   Glucose, Bld 98 70 - 99 mg/dL    Comment: Glucose reference range applies only to samples taken after fasting for at least 8 hours.   BUN 11 6 - 20 mg/dL   Creatinine, Ser 1.61 0.61 - 1.24 mg/dL   Calcium 8.3 (L) 8.9 - 10.3 mg/dL   GFR, Estimated >09  >60 mL/min    Comment: (NOTE) Calculated using the CKD-EPI Creatinine Equation (2021)    Anion gap 7 5 - 15    Comment: Performed at [facility], [address] [place], [place] [address]  CBC with Differential     Status: None   Collection Time: 03/28/23  1:26 PM  Result Value Ref Range   WBC 7.2 4.0 - 10.5 K/uL   RBC 4.47 4.22 - 5.81 MIL/uL   Hemoglobin 14.0 13.0 - 17.0 g/dL   HCT 81.1 91.4 - 78.2 %   MCV 94.2 80.0 - 100.0 fL   MCH 31.3 26.0 - 34.0 pg   MCHC 33.3 30.0 - 36.0 g/dL   RDW 95.6 21.3 - 08.6 %   Platelets 166 150 - 400 K/uL   nRBC 0.0 0.0 - 0.2 %   Neutrophils Relative % 48 %   Neutro Abs 3.4 1.7 - 7.7 K/uL   Lymphocytes Relative 35 %   Lymphs Abs 2.5 0.7 - 4.0 K/uL   Monocytes Relative 11 %   Monocytes Absolute 0.8 0.1 - 1.0 K/uL   Eosinophils Relative 5 %   Eosinophils Absolute 0.4 0.0 - 0.5 K/uL   Basophils Relative 1 %   Basophils Absolute 0.1 0.0 - 0.1 K/uL   Immature Granulocytes 0 %   Abs Immature Granulocytes  0.02 0.00 - 0.07 K/uL    Comment: Performed at [facility], [address], [place], [place] [address]    DG Knee Right Port  Result Date: 03/28/2023 CLINICAL DATA:  Patient was trimming trees with a chain saw. When set chainsaw down, lacerated left knee with chain saw. 9 x 3 cm laceration present. EXAM: PORTABLE RIGHT KNEE - 1-2 VIEW COMPARISON:  None Available. FINDINGS: Normal bone mineralization. Joint spaces are preserved. No acute fracture is seen. There is a mild-to-moderate amount of air within the suprapatellar joint space. No significant joint effusion. Mild superior patellar degenerative osteophytosis. Mild chronic enthesopathic change at the quadriceps insertion on the patella. IMPRESSION: 1. No acute fracture. 2. Mild-to-moderate amount of air within the suprapatellar joint space, presumably from an open wound/connection of the reported known laceration to the joint space. Recommend clinical correlation. Electronically Signed    By: Neita Garnet M.D.   On: 03/28/2023 14:31    Review of Systems Blood pressure 131/77, pulse (!) 59, temperature 98.1 F (36.7 C), temperature source Oral, resp. rate 10, height 6\' 7"  (2.007 m), weight 90.7 kg, SpO2 100%. Physical Exam Constitutional:      General: He is not in acute distress.    Appearance: Normal appearance.  HENT:     Head: Normocephalic and atraumatic.     Nose: Nose normal.     Mouth/Throat:     Mouth: Mucous membranes are dry.  Eyes:     General: No scleral icterus.    Extraocular Movements: Extraocular movements intact.  Pulmonary:     Effort: Pulmonary effort is normal. No respiratory distress.  Abdominal:     General: Abdomen is flat.  Musculoskeletal:     Comments: Right knee shows large irregular laceration about the medial aspect of the right knee anteriorly.  There is exposed subcutaneous tissue and bone.  No evidence of ongoing bleeding.  Patella appears midline.  He is able to do a straight leg raise and actively flex the knee.  Warm and well-perfused distally with intact sensation.  Neurological:     General: No focal deficit present.     Mental Status: He is alert and oriented to person, place, and time.  Psychiatric:        Mood and Affect: Mood normal.        Behavior: Behavior normal.     Assessment/Plan: Large right knee laceration from chainsaw, possible traumatic arthrotomy  Patient agreeable to wound exploration and I&D in the OR today with Dr. Susa Simmonds.  Timing dependent on OR availability and he understands that. Please keep NPO.  Pain control as needed.  Nansi Birmingham J [place] 03/28/2023, 3:36 PM

## 2023-03-28 NOTE — ED Notes (Signed)
Pt's wife at bedside.

## 2023-03-28 NOTE — Anesthesia Postprocedure Evaluation (Signed)
Anesthesia Post Note  Patient: Uzair Godley  Procedure(s) Performed: IRRIGATION AND DEBRIDEMENT OF RIGHT KNEE (Right: Knee)     Patient location during evaluation: PACU Anesthesia Type: General Level of consciousness: awake and alert Pain management: pain level controlled Vital Signs Assessment: post-procedure vital signs reviewed and stable Respiratory status: spontaneous breathing, nonlabored ventilation, respiratory function stable and patient connected to nasal cannula oxygen Cardiovascular status: blood pressure returned to baseline and stable Postop Assessment: no apparent nausea or vomiting Anesthetic complications: no  No notable events documented.  Last Vitals:  Vitals:   03/28/23 2045 03/28/23 2100  BP: (!) 86/48 (!) 159/84  Pulse: 72 83  Resp:  (!) 21  Temp: (!) 36.1 C   SpO2: 95% 93%    Last Pain:  Vitals:   03/28/23 2045  TempSrc:   PainSc: Asleep                 Merrill Villarruel S

## 2023-03-28 NOTE — ED Notes (Signed)
Trauma Response Nurse Documentation   Steven Black is a 47 y.o. male arriving to [facility] ED via EMS  On No antithrombotic. Trauma was activated as a Level 2 by ED Charge RN based on the following trauma criteria Discretion of Emergency Department Physician.  Patient did not require CT at this time.  GCS 15.  History   Past Medical History:  Diagnosis Date   Cannabis hyperemesis syndrome concurrent with and due to cannabis abuse (HCC)    Emesis    Gastroparesis    Mega cisterna magna (HCC)    07/24/18 incidental finding     Past Surgical History:  Procedure Laterality Date   CHOLECYSTECTOMY       Initial Focused Assessment (If applicable, or please see trauma documentation): - Airway intact - Breath sounds clear, equal bilaterally - GCS 15 - A/Ox4 - Diaphoretic  - VSS - PERRLA - Large Laceration to R knee (see media for pic) - 18G PIV to L AC  CT's Completed:   none   Interventions:  - Trauma labs - 2g Ancef given @ 1341 - tdap given @ 1341 - NS bolus 1L - 4mg  Morphine given - R knee XR - Consulted Ortho  Plan for disposition:  OR - sometime today - Keep NPO  Consults completed:  Orthopaedic Surgeon at 1339 - Charma Igo.  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  Not much blood was lost and he and his wife wrapped it quickly to control bleeding.  Pt in severe pain.  Pt ate lunch @ 1130 which consisted of a ham and cheese sandwich.  Plans to take him to OR sometime today with Dr. Susa Simmonds for a washout.    Bedside handoff with ED RN Hannie.    Janora Norlander  Trauma Response RN  Please call TRN at [phone] for further assistance.

## 2023-03-28 NOTE — Progress Notes (Signed)
Orthopedic Tech Progress Note Patient Details:  Steven Black 1975/11/27 [id number]  L2 Trauma. Patient ID: Steven Black, male   DOB: [birth year], 47 y.o.   MRN: [id number]  Steven Black 03/28/2023, 2:42 PM

## 2023-03-28 NOTE — Consult Note (Signed)
Reason for Consult:Right knee lac Referring Physician: Jacalyn Lefevre Time called: 1329 Time at bedside: 1332   Steven Black is an 47 y.o. male.  HPI: Steven Black was cutting a tree in his yard with a chainsaw and accidentally cut his right leg. He was brought to the ED as a level 2 trauma activation. Open knee joint was suspected and orthopedic surgery was consulted. He works as a Comptroller.  Past Medical History:  Diagnosis Date   Cannabis hyperemesis syndrome concurrent with and due to cannabis abuse (HCC)    Emesis    Gastroparesis    Mega cisterna magna (HCC)    07/24/18 incidental finding    Past Surgical History:  Procedure Laterality Date   CHOLECYSTECTOMY      History reviewed. No pertinent family history.  Social History:  reports that he has been smoking cigarettes. He has never used smokeless tobacco. He reports current drug use. Drug: Marijuana. He reports that he does not drink alcohol.  Allergies: No Known Allergies  Medications: I have reviewed the patient's current medications.  Results for orders placed or performed during the hospital encounter of 03/28/23 (from the past 48 hour(s))  CBC with Differential     Status: None   Collection Time: 03/28/23  1:26 PM  Result Value Ref Range   WBC 7.2 4.0 - 10.5 K/uL   RBC 4.47 4.22 - 5.81 MIL/uL   Hemoglobin 14.0 13.0 - 17.0 g/dL   HCT 95.6 21.3 - 08.6 %   MCV 94.2 80.0 - 100.0 fL   MCH 31.3 26.0 - 34.0 pg   MCHC 33.3 30.0 - 36.0 g/dL   RDW 57.8 46.9 - 62.9 %   Platelets 166 150 - 400 K/uL   nRBC 0.0 0.0 - 0.2 %   Neutrophils Relative % 48 %   Neutro Abs 3.4 1.7 - 7.7 K/uL   Lymphocytes Relative 35 %   Lymphs Abs 2.5 0.7 - 4.0 K/uL   Monocytes Relative 11 %   Monocytes Absolute 0.8 0.1 - 1.0 K/uL   Eosinophils Relative 5 %   Eosinophils Absolute 0.4 0.0 - 0.5 K/uL   Basophils Relative 1 %   Basophils Absolute 0.1 0.0 - 0.1 K/uL   Immature Granulocytes 0 %   Abs Immature Granulocytes 0.02 0.00 -  0.07 K/uL    Comment: Performed at [facility], [address], [place], [place] [address]    No results found.  Review of Systems  HENT:  Negative for ear discharge, ear pain, hearing loss and tinnitus.   Eyes:  Negative for photophobia and pain.  Respiratory:  Negative for cough and shortness of breath.   Cardiovascular:  Negative for chest pain.  Gastrointestinal:  Negative for abdominal pain, nausea and vomiting.  Genitourinary:  Negative for dysuria, flank pain, frequency and urgency.  Musculoskeletal:  Positive for arthralgias (Right knee). Negative for back pain, myalgias and neck pain.  Neurological:  Negative for dizziness and headaches.  Hematological:  Does not bruise/bleed easily.  Psychiatric/Behavioral:  The patient is not nervous/anxious.    Temperature 98.1 F (36.7 C), temperature source Oral, height 6\' 7"  (2.007 m), weight 90.7 kg. Physical Exam Constitutional:      General: He is not in acute distress.    Appearance: He is well-developed. He is not diaphoretic.  HENT:     Head: Normocephalic and atraumatic.  Eyes:     General: No scleral icterus.       Right eye: No discharge.  Left eye: No discharge.     Conjunctiva/sclera: Conjunctivae normal.  Cardiovascular:     Rate and Rhythm: Normal rate and regular rhythm.  Pulmonary:     Effort: Pulmonary effort is normal. No respiratory distress.  Musculoskeletal:     Cervical back: Normal range of motion.     Comments: RLE Large medial irregular knee laceration, no ecchymosis or rash  Mod TTP  No ankle effusion  Sens DPN, SPN, TN intact  Motor EHL, ext, flex, evers 5/5  DP 1+, PT 1+, No significant edema  Skin:    General: Skin is warm and dry.  Neurological:     Mental Status: He is alert.  Psychiatric:        Mood and Affect: Mood normal.        Behavior: Behavior normal.     Assessment/Plan: Right knee laceration w/open joint -- Plan I&D today by Dr. Susa Simmonds. Please keep  NPO.    Freeman Caldron, PA-C Orthopedic Surgery [phone] 03/28/2023, 1:43 PM

## 2023-03-29 ENCOUNTER — Encounter (HOSPITAL_COMMUNITY): Payer: Self-pay | Admitting: Orthopaedic Surgery

## 2023-03-29 NOTE — Op Note (Signed)
Steven Black male 47 y.o. 03/28/2023  PreOperative Diagnosis: Traumatic chainsaw wound to right thigh and leg at the level of the knee, 11 x 4 cm  PostOperative Diagnosis: Traumatic chainsaw wound to right thigh and leg at the level of the knee, 11 x 4 cm Traumatic knee arthrotomy Cartilage defect of the medial femoral condyle Disruption of medial patellofemoral ligament, knee joint capsule Bony injury to the medial aspect of the patella without discrete fracture  PROCEDURE: Surgical wound exploration of large chainsaw wound to the medial knee, leg and thigh area 11 x 4 cm Irrigation and excisional debridement of traumatic knee wound including skin, subcutaneous tissue, fascia, ligament and bone 11 x 4 cm Repair of medial patellofemoral ligament and knee capsule Complex wound closure, 11 cm   SURGEON: Dub Mikes, MD  ASSISTANT: Jesse [place], PA-C was necessary for patient positioning, prep, drape, assistance with retraction and wound closure  ANESTHESIA: General endotracheal anesthesia  FINDINGS: See postoperative diagnosis  IMPLANTS: No implants  INDICATIONS:46 y.o. male right sustained a chainsaw injury to his right leg and thigh around the level of his knee medially.  There was concern for traumatic knee arthrotomy and therefore he was indicated for formal exploration and repair as needed in the operating room.   Patient understood the risks, benefits and alternatives to surgery which include but are not limited to wound healing complications, infection, nonunion, malunion, need for further surgery as well as damage to surrounding structures. They also understood the potential for continued pain in that there were no guarantees of acceptable outcome After weighing these risks the patient opted to proceed with surgery.  PROCEDURE: Patient was identified in the preoperative holding area.  The right leg was marked by myself.  Consent was signed by myself and the patient.   Block was performed by anesthesia in the preoperative holding area.  Patient was taken to the operative suite and placed supine on the operative table.  General endotracheal anesthesia was induced without difficulty. Bump was placed under the operative hip and bone foam was used.  All bony prominences were well padded.  Preoperative antibiotics were given. The extremity was prepped and draped in the usual sterile fashion and surgical timeout was performed.    We began by exploring the wound.  There was a large traumatic chainsaw wound to the medial aspect of his distal thigh, knee area and leg.  The wound measured approximately 11 cm x 4 cm.  There was disruption and jagged edges about the skin and subcutaneous tissue.  There was some devitalized tissue there.  Deeper to this area there was evidence of disruption of the medial patellofemoral ligament and knee capsule.  There was a large traumatic knee arthrotomy with multiple traumatic wounds throughout the capsular tissue.  It was noted that the chainsaw had violated the medial aspect of the patella but there was no obvious fracturing.  There is exposed cancellous bone of the patella.  Deeper within the knee joint it was noted that there was a small chondral defect of the anterior aspect of the distal femoral condyle.  Again no obvious fracture was noted.  There was no obvious disruption of the quadriceps tendon or the patellar tendon.  The knee was taken through range of motion and the patella was noted to track normally.  We then proceeded with excisional debridement of the wound.  Debridement type: Excisional Debridement  Side: right  Body Location: Leg and knee region   Tools used for debridement: scalpel, scissors,  curette, and rongeur  Pre-debridement Wound size (cm):   Length: 11        Width: 4     Depth: 5   Post-debridement Wound size (cm):   Length: 11        Width: 4.5     Depth: 5   Debridement depth beyond dead/damaged tissue down to  healthy viable tissue: yes  Tissue layer involved: skin, subcutaneous tissue, muscle / fascia, bone, knee joint  Nature of tissue removed: Devitalized Tissue and Non-viable tissue  Irrigation volume: 6000 cc     Irrigation fluid type: Normal Saline  After irrigation debridement of the traumatic wound we proceeded to repair damage structures.  Using a 2-0 Vicryl suture the medial patellofemoral ligament and knee joint capsule was repaired.  This was done in a layered fashion beginning with the capsule and then completing with the ligamentous structures.  This was done in the running baseball stitch suture technique.  After repair of the ligament and the knee capsule the knee was taken through range of motion and it was found to be stable without any gapping of the capsular or ligamentous tissue.  We then proceeded to perform complex closure of the remaining large wound.  Using a 2-0 Vicryl, two 2-0 Monocryl and a 2-0 nylon suture the needed wound that measured 11 cm x 4.5 cm this was repaired in a complex layered closure fashion.  There was good skin apposition and no significant tension on the wound after closure.  Again the knee was taken through range of motion and was found that the knee wound was stable without any gapping.     Then the knee was cleaned.  Soft dressing was placed.  He was placed in a knee immobilizer.   Patient was then awakened from anesthesia and taken recovery in stable condition.  No complications.  Counts were correct.     POST OPERATIVE INSTRUCTIONS: Weightbearing as tolerated in a knee immobilizer Avoid significant knee flexion until seen in the office Follow-up in 1 week for wound check  TOURNIQUET TIME: No tourniquet  BLOOD LOSS:  less than 50 mL         DRAINS: none         SPECIMEN: none       COMPLICATIONS:  * No complications entered in OR log *         Disposition: PACU - hemodynamically stable.         Condition: stable
